# Patient Record
Sex: Female | Born: 1935 | Race: White | Hispanic: No | Smoking: Former smoker
Health system: Southern US, Community
[De-identification: ages and names within clinical notes are randomized; demographics above are authoritative.]

## PROBLEM LIST (undated history)

## (undated) DIAGNOSIS — I10 Essential (primary) hypertension: Secondary | ICD-10-CM

## (undated) DIAGNOSIS — I2510 Atherosclerotic heart disease of native coronary artery without angina pectoris: Secondary | ICD-10-CM

## (undated) DIAGNOSIS — I350 Nonrheumatic aortic (valve) stenosis: Secondary | ICD-10-CM

## (undated) DIAGNOSIS — I4891 Unspecified atrial fibrillation: Secondary | ICD-10-CM

## (undated) HISTORY — PX: TRANSCATHETER AORTIC VALVE REPLACEMENT, TRANSFEMORAL: SHX6400

---

## 2019-06-25 DIAGNOSIS — I350 Nonrheumatic aortic (valve) stenosis: Secondary | ICD-10-CM

## 2019-06-25 DIAGNOSIS — I480 Paroxysmal atrial fibrillation: Secondary | ICD-10-CM | POA: Diagnosis present

## 2019-06-25 DIAGNOSIS — I2510 Atherosclerotic heart disease of native coronary artery without angina pectoris: Secondary | ICD-10-CM | POA: Diagnosis present

## 2019-06-25 DIAGNOSIS — E782 Mixed hyperlipidemia: Secondary | ICD-10-CM | POA: Diagnosis present

## 2019-06-25 DIAGNOSIS — I10 Essential (primary) hypertension: Secondary | ICD-10-CM | POA: Diagnosis present

## 2019-08-23 DIAGNOSIS — Z952 Presence of prosthetic heart valve: Secondary | ICD-10-CM

## 2019-10-30 ENCOUNTER — Encounter (HOSPITAL_COMMUNITY): Payer: Self-pay | Admitting: Family Medicine

## 2019-10-30 ENCOUNTER — Emergency Department (HOSPITAL_COMMUNITY): Payer: Medicare Other

## 2019-10-30 ENCOUNTER — Other Ambulatory Visit: Payer: Self-pay

## 2019-10-30 ENCOUNTER — Inpatient Hospital Stay (HOSPITAL_COMMUNITY)
Admission: EM | Admit: 2019-10-30 | Discharge: 2019-11-01 | DRG: 071 | Disposition: A | Discharge status: Home Health | Payer: Medicare Other | Attending: Internal Medicine | Admitting: Internal Medicine

## 2019-10-30 ENCOUNTER — Inpatient Hospital Stay (HOSPITAL_COMMUNITY): Payer: Medicare Other

## 2019-10-30 DIAGNOSIS — E041 Nontoxic single thyroid nodule: Secondary | ICD-10-CM | POA: Diagnosis present

## 2019-10-30 DIAGNOSIS — Z91040 Latex allergy status: Secondary | ICD-10-CM

## 2019-10-30 DIAGNOSIS — I480 Paroxysmal atrial fibrillation: Secondary | ICD-10-CM | POA: Diagnosis present

## 2019-10-30 DIAGNOSIS — Z952 Presence of prosthetic heart valve: Secondary | ICD-10-CM

## 2019-10-30 DIAGNOSIS — Z7901 Long term (current) use of anticoagulants: Secondary | ICD-10-CM | POA: Diagnosis not present

## 2019-10-30 DIAGNOSIS — E782 Mixed hyperlipidemia: Secondary | ICD-10-CM | POA: Diagnosis present

## 2019-10-30 DIAGNOSIS — F329 Major depressive disorder, single episode, unspecified: Secondary | ICD-10-CM | POA: Diagnosis present

## 2019-10-30 DIAGNOSIS — Z880 Allergy status to penicillin: Secondary | ICD-10-CM

## 2019-10-30 DIAGNOSIS — Z882 Allergy status to sulfonamides status: Secondary | ICD-10-CM

## 2019-10-30 DIAGNOSIS — I2510 Atherosclerotic heart disease of native coronary artery without angina pectoris: Secondary | ICD-10-CM | POA: Diagnosis not present

## 2019-10-30 DIAGNOSIS — I350 Nonrheumatic aortic (valve) stenosis: Secondary | ICD-10-CM

## 2019-10-30 DIAGNOSIS — K219 Gastro-esophageal reflux disease without esophagitis: Secondary | ICD-10-CM | POA: Diagnosis present

## 2019-10-30 DIAGNOSIS — I10 Essential (primary) hypertension: Secondary | ICD-10-CM | POA: Diagnosis present

## 2019-10-30 DIAGNOSIS — G9341 Metabolic encephalopathy: Principal | ICD-10-CM | POA: Diagnosis present

## 2019-10-30 DIAGNOSIS — R4701 Aphasia: Secondary | ICD-10-CM | POA: Diagnosis present

## 2019-10-30 DIAGNOSIS — R479 Unspecified speech disturbances: Secondary | ICD-10-CM

## 2019-10-30 DIAGNOSIS — Z888 Allergy status to other drugs, medicaments and biological substances status: Secondary | ICD-10-CM

## 2019-10-30 DIAGNOSIS — Z20822 Contact with and (suspected) exposure to covid-19: Secondary | ICD-10-CM | POA: Diagnosis present

## 2019-10-30 DIAGNOSIS — Z79899 Other long term (current) drug therapy: Secondary | ICD-10-CM

## 2019-10-30 DIAGNOSIS — E876 Hypokalemia: Secondary | ICD-10-CM | POA: Diagnosis present

## 2019-10-30 HISTORY — DX: Unspecified atrial fibrillation: I48.91

## 2019-10-30 HISTORY — DX: Atherosclerotic heart disease of native coronary artery without angina pectoris: I25.10

## 2019-10-30 HISTORY — DX: Essential (primary) hypertension: I10

## 2019-10-30 HISTORY — DX: Nonrheumatic aortic (valve) stenosis: I35.0

## 2019-10-30 LAB — CBC
HCT: 34.4 % — ABNORMAL LOW (ref 36.0–46.0)
Hemoglobin: 10.2 g/dL — ABNORMAL LOW (ref 12.0–15.0)
MCH: 21.9 pg — ABNORMAL LOW (ref 26.0–34.0)
MCHC: 29.7 g/dL — ABNORMAL LOW (ref 30.0–36.0)
MCV: 74 fL — ABNORMAL LOW (ref 80.0–100.0)
Platelets: 203 10*3/uL (ref 150–400)
RBC: 4.65 MIL/uL (ref 3.87–5.11)
RDW: 18.3 % — ABNORMAL HIGH (ref 11.5–15.5)
WBC: 8.5 10*3/uL (ref 4.0–10.5)
nRBC: 0 % (ref 0.0–0.2)

## 2019-10-30 LAB — DIFFERENTIAL
Abs Immature Granulocytes: 0.02 10*3/uL (ref 0.00–0.07)
Basophils Absolute: 0 10*3/uL (ref 0.0–0.1)
Basophils Relative: 1 %
Eosinophils Absolute: 0.1 10*3/uL (ref 0.0–0.5)
Eosinophils Relative: 1 %
Immature Granulocytes: 0 %
Lymphocytes Relative: 17 %
Lymphs Abs: 1.4 10*3/uL (ref 0.7–4.0)
Monocytes Absolute: 0.5 10*3/uL (ref 0.1–1.0)
Monocytes Relative: 6 %
Neutro Abs: 6.4 10*3/uL (ref 1.7–7.7)
Neutrophils Relative %: 75 %

## 2019-10-30 LAB — COMPREHENSIVE METABOLIC PANEL
ALT: 15 U/L (ref 0–44)
AST: 19 U/L (ref 15–41)
Albumin: 3.6 g/dL (ref 3.5–5.0)
Alkaline Phosphatase: 100 U/L (ref 38–126)
Anion gap: 13 (ref 5–15)
BUN: 11 mg/dL (ref 8–23)
CO2: 28 mmol/L (ref 22–32)
Calcium: 10.4 mg/dL — ABNORMAL HIGH (ref 8.9–10.3)
Chloride: 99 mmol/L (ref 98–111)
Creatinine, Ser: 0.65 mg/dL (ref 0.44–1.00)
GFR calc Af Amer: >60 mL/min (ref >60)
GFR calc non Af Amer: >60 mL/min (ref >60)
Glucose, Bld: 97 mg/dL (ref 70–99)
Potassium: 2.9 mmol/L — ABNORMAL LOW (ref 3.5–5.1)
Sodium: 140 mmol/L (ref 135–145)
Total Bilirubin: 1.2 mg/dL (ref 0.3–1.2)
Total Protein: 7.1 g/dL (ref 6.5–8.1)

## 2019-10-30 LAB — RAPID URINE DRUG SCREEN, HOSP PERFORMED
Amphetamines: NOT DETECTED
Barbiturates: NOT DETECTED
Benzodiazepines: NOT DETECTED
Cocaine: NOT DETECTED
Opiates: NOT DETECTED
Tetrahydrocannabinol: NOT DETECTED

## 2019-10-30 LAB — URINALYSIS, ROUTINE W REFLEX MICROSCOPIC
Bacteria, UA: NONE SEEN
Bilirubin Urine: NEGATIVE
Glucose, UA: NEGATIVE mg/dL
Hgb urine dipstick: NEGATIVE
Ketones, ur: NEGATIVE mg/dL
Nitrite: NEGATIVE
Protein, ur: NEGATIVE mg/dL
Specific Gravity, Urine: 1.004 — ABNORMAL LOW (ref 1.005–1.030)
pH: 6 (ref 5.0–8.0)

## 2019-10-30 LAB — MAGNESIUM: Magnesium: 1.5 mg/dL — ABNORMAL LOW (ref 1.7–2.4)

## 2019-10-30 LAB — PROTIME-INR
INR: 1.7 — ABNORMAL HIGH (ref 0.8–1.2)
Prothrombin Time: 19.2 seconds — ABNORMAL HIGH (ref 11.4–15.2)

## 2019-10-30 LAB — SARS CORONAVIRUS 2 BY RT PCR (HOSPITAL ORDER, PERFORMED IN ~~LOC~~ HOSPITAL LAB): SARS Coronavirus 2: NEGATIVE

## 2019-10-30 LAB — ETHANOL: Alcohol, Ethyl (B): <10 mg/dL (ref <10)

## 2019-10-30 LAB — APTT: aPTT: 42 seconds — ABNORMAL HIGH (ref 24–36)

## 2019-10-30 MED ORDER — POTASSIUM CHLORIDE CRYS ER 20 MEQ PO TBCR
40.0000 meq | EXTENDED_RELEASE_TABLET | Freq: Two times a day (BID) | ORAL | Status: DC
  Administered 2019-10-30 – 2019-11-01 (×4): 40 meq via ORAL
  Filled 2019-10-30: qty 4
  Filled 2019-10-30 (×3): qty 2

## 2019-10-30 MED ORDER — ESCITALOPRAM OXALATE 10 MG PO TABS
10.0000 mg | ORAL_TABLET | Freq: Every day | ORAL | Status: DC
  Administered 2019-10-30 – 2019-10-31 (×2): 10 mg via ORAL
  Filled 2019-10-30 (×2): qty 1

## 2019-10-30 MED ORDER — ASPIRIN 300 MG RE SUPP: 300.0000 mg | Freq: Every day | RECTAL | Status: DC

## 2019-10-30 MED ORDER — STROKE: EARLY STAGES OF RECOVERY BOOK
Freq: Once | Status: DC
  Filled 2019-10-30: qty 1

## 2019-10-30 MED ORDER — ACETAMINOPHEN 160 MG/5ML PO SOLN: 650.0000 mg | ORAL | Status: DC | PRN

## 2019-10-30 MED ORDER — IOHEXOL 350 MG/ML SOLN
75.0000 mL | Freq: Once | INTRAVENOUS | Status: AC | PRN
  Administered 2019-10-30: 75 mL via INTRAVENOUS

## 2019-10-30 MED ORDER — SENNOSIDES-DOCUSATE SODIUM 8.6-50 MG PO TABS
1.0000 | ORAL_TABLET | Freq: Every evening | ORAL | Status: DC | PRN
  Filled 2019-10-30: qty 1

## 2019-10-30 MED ORDER — ACETAMINOPHEN 325 MG PO TABS: 650.0000 mg | ORAL_TABLET | ORAL | Status: DC | PRN

## 2019-10-30 MED ORDER — ACETAMINOPHEN 650 MG RE SUPP: 650.0000 mg | RECTAL | Status: DC | PRN

## 2019-10-30 MED ORDER — APIXABAN 5 MG PO TABS
5.0000 mg | ORAL_TABLET | Freq: Two times a day (BID) | ORAL | Status: DC
  Administered 2019-10-30 – 2019-11-01 (×4): 5 mg via ORAL
  Filled 2019-10-30 (×4): qty 1

## 2019-10-30 MED ORDER — MAGNESIUM SULFATE 50 % IJ SOLN: 2.0000 g | Freq: Once | INTRAMUSCULAR | Status: DC

## 2019-10-30 MED ORDER — POTASSIUM CHLORIDE CRYS ER 20 MEQ PO TBCR
40.0000 meq | EXTENDED_RELEASE_TABLET | Freq: Once | ORAL | Status: AC
  Administered 2019-10-30: 40 meq via ORAL
  Filled 2019-10-30: qty 2

## 2019-10-30 MED ORDER — ASPIRIN 325 MG PO TABS
325.0000 mg | ORAL_TABLET | Freq: Every day | ORAL | Status: DC
  Administered 2019-10-31 – 2019-11-01 (×2): 325 mg via ORAL
  Filled 2019-10-30 (×2): qty 1

## 2019-10-30 MED ORDER — PANTOPRAZOLE SODIUM 40 MG PO TBEC
40.0000 mg | DELAYED_RELEASE_TABLET | Freq: Every day | ORAL | Status: DC
  Administered 2019-10-31 – 2019-11-01 (×2): 40 mg via ORAL
  Filled 2019-10-30 (×2): qty 1

## 2019-10-30 MED ORDER — FUROSEMIDE 20 MG PO TABS
20.0000 mg | ORAL_TABLET | Freq: Every morning | ORAL | Status: DC
  Administered 2019-10-31: 20 mg via ORAL
  Filled 2019-10-30: qty 1

## 2019-10-30 MED ORDER — MAGNESIUM SULFATE 2 GM/50ML IV SOLN
2.0000 g | Freq: Once | INTRAVENOUS | Status: AC
  Administered 2019-10-30: 2 g via INTRAVENOUS
  Filled 2019-10-30: qty 50

## 2019-10-30 NOTE — ED Triage Notes (Signed)
Per EMS, pt family c/o slurred speech, dizziness, and blurry vision first presenting 1-2 weeks ago with progressive worsening. Family also reports choking on food last night and weight loss since January. Family concerned about loose fitting dentures and oral intake from weight loss.

## 2019-10-30 NOTE — H&P (Signed)
History and Physical  [facility] DDU:[id number] DOB: [birth year] DOA: 10/30/2019  Referring physician: Dr Particia Nearing, ED physician PCP: System, Pcp Not In  Outpatient Specialists:   Dr Neysa Bonito [facility])  Patient Coming From: home  Chief Complaint: difficulty speaking.  HPI: Lindsay Scott is a 85 y.o. female with a history of nonrheumatic aortic stenosis with history of transcatheter valve replacement, paroxysmal atrial fibrillation on chronic anticoagulation with Eliquis.  Patient seen for 10 days of slurred speech and dizziness.  Initially, her symptoms were mild, but they became gradually worse.  Initially, her family members thought it was due to her weight loss and her dentures not fitting well.  However, her symptoms became worse yesterday and even worse today where her family was having a difficult time understanding her.  Although they can make out some words, there are many other words that appear to be nonsensical in nature.  In talking with the patient, the patient indicates that she understands what is being said, and knows what she wants to say, however the words come out garbled.  Additionally, the patient has been having vertigo and had a headache yesterday.  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  Potassium low at 2.9.  Magnesium at 1.5.  Calcium slightly elevated at 10.4.  Review of Systems:   Pt denies any fevers, chills, nausea, vomiting, diarrhea, constipation, abdominal pain, shortness of breath, dyspnea on exertion, orthopnea, cough, wheezing, palpitations, headache, vision changes, lightheadedness, melena, rectal bleeding.  Review of systems are otherwise negative  Past Medical History:  Diagnosis Date  . Atrial fibrillation (HCC)   . Coronary artery disease   . Hypertension   . Nonrheumatic aortic (valve) stenosis    Past Surgical History:  Procedure Laterality Date  . TRANSCATHETER AORTIC  VALVE REPLACEMENT, TRANSFEMORAL     Social History:  has no history on file for tobacco use, alcohol use, and drug use. Patient lives at home  Allergies  Allergen Reactions  . Betadine [Povidone Iodine] Other (See Comments)  . Latex Other (See Comments)    unkn  . Penicillins Swelling  . Sulfa Antibiotics Other (See Comments)    unkn    No family history on file.  Family history reviewed and noncontributory at this point  Prior to Admission medications   Medication Sig Start Date End Date Taking? Authorizing Provider  apixaban (ELIQUIS) 5 MG TABS tablet Take 5 mg by mouth 2 (two) times daily.   Yes [provider]  escitalopram (LEXAPRO) 10 MG tablet Take 10 mg by mouth at bedtime.   Yes [provider]  ferrous sulfate 325 (65 FE) MG EC tablet Take 325 mg by mouth daily.   Yes [provider]  folic acid (FOLVITE) 1 MG tablet Take 1 mg by mouth daily.   Yes [provider]  furosemide (LASIX) 20 MG tablet Take 20 mg by mouth in the morning.   Yes [provider]  meloxicam (MOBIC) 7.5 MG tablet Take 7.5 mg by mouth in the morning.   Yes [provider]  metoprolol tartrate (LOPRESSOR) 100 MG tablet Take 100 mg by mouth 2 (two) times daily.   Yes [provider]  omeprazole (PRILOSEC) 20 MG capsule Take 20 mg by mouth daily before breakfast.   Yes [provider]  potassium chloride (KLOR-CON) 10 MEQ tablet Take 10 mEq by mouth daily.   Yes [provider]  Vitamin D, Ergocalciferol, (DRISDOL)  1.25 MG (50000 UNIT) CAPS capsule Take 50,000 Units by mouth every Sunday.   Yes [provider]    Physical Exam: BP (!) 152/82   Pulse (!) 102   Resp 19   SpO2 99%   . General: Elderly female. Awake and alert. No acute cardiopulmonary distress.  Marland Kitchen HEENT: Normocephalic atraumatic.  Right and left ears normal in appearance.  Pupils equal, round, reactive to light. Extraocular muscles are intact.  Sclerae anicteric and noninjected.  Moist mucosal membranes. No mucosal lesions.  . Neck: Neck supple without lymphadenopathy. No carotid bruits. No masses palpated.  . Cardiovascular: Regular rate with normal S1-S2 sounds. No murmurs, rubs, gallops auscultated. No JVD.  Marland Kitchen Respiratory: Good respiratory effort with no wheezes, rales, rhonchi. Lungs clear to auscultation bilaterally.  No accessory muscle use. . Abdomen: Soft, nontender, nondistended. Active bowel sounds. No masses or hepatosplenomegaly  . Skin: No rashes, lesions, or ulcerations.  Dry, warm to touch. 2+ dorsalis pedis and radial pulses. . Musculoskeletal: No calf or leg pain. All major joints not erythematous nontender.  No upper or lower joint deformation.  Good ROM.  No contractures  . Psychiatric: Difficult to assess secondary to patient's speech deficits. . Neurologic: No focal neurological deficits. Strength is 5/5 and symmetric in upper and lower extremities.  Cranial nerves II through XII are grossly intact.  Coordination intact           Labs on Admission: I have personally reviewed following labs and imaging studies  CBC: Recent Labs  Lab 10/30/19 1915  WBC 8.5  NEUTROABS 6.4  HGB 10.2*  HCT 34.4*  MCV 74.0*  PLT 203   Basic Metabolic Panel: Recent Labs  Lab 10/30/19 1915  NA 140  K 2.9*  CL 99  CO2 28  GLUCOSE 97  BUN 11  CREATININE 0.65  CALCIUM 10.4*  MG 1.5*   GFR: CrCl cannot be calculated (Unknown ideal weight.). Liver Function Tests: Recent Labs  Lab 10/30/19 1915  AST 19  ALT 15  ALKPHOS 100  BILITOT 1.2  PROT 7.1  ALBUMIN 3.6   No results for input(s): LIPASE, AMYLASE in the last 168 hours. No results for input(s): AMMONIA in the last 168 hours. Coagulation Profile: Recent Labs  Lab 10/30/19 1915  INR 1.7*   Cardiac Enzymes: No results for input(s): CKTOTAL, CKMB, CKMBINDEX, TROPONINI in the last 168 hours. BNP (last 3 results) No results for input(s): PROBNP in the last  8760 hours. HbA1C: No results for input(s): HGBA1C in the last 72 hours. CBG: No results for input(s): GLUCAP in the last 168 hours. Lipid Profile: No results for input(s): CHOL, HDL, LDLCALC, TRIG, CHOLHDL, LDLDIRECT in the last 72 hours. Thyroid Function Tests: No results for input(s): TSH, T4TOTAL, FREET4, T3FREE, THYROIDAB in the last 72 hours. Anemia Panel: No results for input(s): VITAMINB12, FOLATE, FERRITIN, TIBC, IRON, RETICCTPCT in the last 72 hours. Urine analysis: No results found for: COLORURINE, APPEARANCEUR, LABSPEC, PHURINE, GLUCOSEU, HGBUR, BILIRUBINUR, KETONESUR, PROTEINUR, UROBILINOGEN, NITRITE, LEUKOCYTESUR Sepsis Labs: @LABRCNTIP (procalcitonin:4,lacticidven:4) )No results found for this or any previous visit (from the past 240 hour(s)).   Radiological Exams on Admission: CT HEAD WO CONTRAST  Result Date: 10/30/2019 CLINICAL DATA:  85 year old with slurred speech, dizziness, and blurred vision which began approximately 1-2 weeks ago with progressive worsening. Episode of choking on food last night. Weight loss since January, 2021. EXAM: CT HEAD WITHOUT CONTRAST TECHNIQUE: Contiguous axial images were obtained from the base of the skull through the vertex without intravenous  contrast. COMPARISON:  None. FINDINGS: Brain: Ventricular system normal in size and appearance for age. Moderate to marked bifrontal cortical atrophy and/or subdural hygromas with mild age related atrophy elsewhere. Mild changes of small vessel disease of the white matter diffusely. No mass lesion. No midline shift. No acute hemorrhage or hematoma. No extra-axial fluid collections. No evidence of acute infarction. Vascular: Moderate to severe BILATERAL carotid siphon and moderate BILATERAL vertebral artery atherosclerosis. No hyperdense vessel. Skull: No skull fracture or other focal osseous abnormality involving the skull. Sinuses/Orbits: Visualized paranasal sinuses, bilateral mastoid air cells and  bilateral middle ear cavities well-aerated. Visualized orbits and globes normal in appearance. Other: None. IMPRESSION: 1. No acute intracranial abnormality. 2. Moderate to marked bifrontal cortical atrophy and/or subdural hygromas with mild age related atrophy elsewhere. 3. Mild chronic microvascular ischemic changes of the white matter diffusely. Electronically Signed   By: Hulan Saas M.D.   On: 10/30/2019 19:51    EKG: Independently reviewed.  Atrial fibrillation with right axis deviation.  No acute ST changes.  Assessment/Plan: Principal Problem:   Aphasia Active Problems:   Essential hypertension   Hyperlipidemia, mixed   Nonrheumatic aortic valve stenosis   PAF (paroxysmal atrial fibrillation) (HCC)   S/P TAVR (transcatheter aortic valve replacement)   Coronary artery disease involving native coronary artery of native heart without angina pectoris   Hypokalemia   Hypomagnesemia    This patient was discussed with the ED physician, including pertinent vitals, physical exam findings, labs, and imaging.  We also discussed care given by the ED provider.  1. Aphasia a. There is a question of whether this is secondary to a stroke or another process.   b. Check TSH, ammonia level, B12 c. Check UA, UDS d. Additionally, the patient's calcium level is barely elevated.  While hypercalcemia can cause altered encephalopathy, it is usually found at levels much higher.  We will recheck calcium in the morning e. Admit f. Telemetry g. MRI/MRA head h. CT angio neck  i. echocardiogram tomorrow j. Hemoglobin A1c, lipid panel in the morning k. PT/OT/speech therapy consult l. Full aspirin m. Permissive hypertension for now 2. Hypokalemia a. Replace b. Check potassium in the morning 3. Hypomagnesemia a. Replace 4. Hypertension a. Permissive hypertension 5. PAF a. On Eliquis 6. Status post aortic valve replacement a.   DVT prophylaxis: Eliquis Consultants: None Code Status: Full  code Family Communication: Daughter present during interview and exam Disposition Plan: Pending   Levie Heritage, DO

## 2019-10-30 NOTE — ED Provider Notes (Signed)
[facility] EMERGENCY DEPARTMENT Provider Note   CSN: [id number] Arrival date & time: 10/30/19  1851     History Chief Complaint  Patient presents with  . Aphasia    Lindsay Scott is a 85 y.o. female.  Pt presents to the ED today with slurred speech.  Pt is from home.  EMS gives the hx.  Per EMS, pt has had slurred speech for the past 2.5 weeks.  She lives with family who thought she was having difficulty speaking due to her not having her dentures in.  Pt is very difficult to understand, so history is difficult.  Pt's daughter called.  She said that her speech has worsened in the last 24 hrs.  She had difficulty swallowing today and looked off balance when walking today.       Pmhx: afib gerd Arthritis chf Depression htn Aortic stenosis HOH   Surg Hx Aortic Valve replacement June 29th  OB History   No obstetric history on file.     No family history on file.  Social History   Tobacco Use  . Smoking status: Not on file  Substance Use Topics  . Alcohol use: Not on file  . Drug use: Not on file  no tob/etoh  Home Medications Prior to Admission medications   Medication Sig Start Date End Date Taking? Authorizing Provider  apixaban (ELIQUIS) 5 MG TABS tablet Take 5 mg by mouth 2 (two) times daily.   Yes [provider]  escitalopram (LEXAPRO) 10 MG tablet Take 10 mg by mouth at bedtime.   Yes [provider]  ferrous sulfate 325 (65 FE) MG EC tablet Take 325 mg by mouth daily.   Yes [provider]  folic acid (FOLVITE) 1 MG tablet Take 1 mg by mouth daily.   Yes [provider]  furosemide (LASIX) 20 MG tablet Take 20 mg by mouth in the morning.   Yes [provider]  meloxicam (MOBIC) 7.5 MG tablet Take 7.5 mg by mouth in the morning.   Yes [provider]  metoprolol tartrate (LOPRESSOR) 100 MG tablet Take 100 mg by mouth 2 (two) times daily.   Yes [provider]  omeprazole (PRILOSEC) 20 MG  capsule Take 20 mg by mouth daily before breakfast.   Yes [provider]  potassium chloride (KLOR-CON) 10 MEQ tablet Take 10 mEq by mouth daily.   Yes [provider]  Vitamin D, Ergocalciferol, (DRISDOL) 1.25 MG (50000 UNIT) CAPS capsule Take 50,000 Units by mouth every Sunday.   Yes [provider]   Omeprazole meloxicam eliquis Iron Folic acid kcl Furosemide Vitamin D Metoprolol escitalopram  Allergies    Betadine [povidone iodine], Latex, Penicillins, and Sulfa antibiotics  Review of Systems   Review of Systems  Neurological: Positive for speech difficulty.  All other systems reviewed and are negative.   Physical Exam Updated Vital Signs BP (!) 152/82   Pulse (!) 105   Resp (!) 21   SpO2 100%   Physical Exam Vitals and nursing note reviewed.  Constitutional:      Appearance: Normal appearance.  HENT:     Head: Normocephalic and atraumatic.     Right Ear: External ear normal.     Left Ear: External ear normal.     Nose: Nose normal.     Mouth/Throat:     Mouth: Mucous membranes are moist.     Pharynx: Oropharynx is clear.  Eyes:     Extraocular Movements: Extraocular movements  intact.     Conjunctiva/sclera: Conjunctivae normal.     Pupils: Pupils are equal, round, and reactive to light.  Cardiovascular:     Rate and Rhythm: Tachycardia present. Rhythm irregular.     Pulses: Normal pulses.     Heart sounds: Normal heart sounds.  Pulmonary:     Effort: Pulmonary effort is normal.     Breath sounds: Normal breath sounds.  Abdominal:     General: Abdomen is flat. Bowel sounds are normal.     Palpations: Abdomen is soft.  Musculoskeletal:        General: Normal range of motion.     Cervical back: Normal range of motion and neck supple.  Skin:    General: Skin is warm.     Capillary Refill: Capillary refill takes less than 2 seconds.  Neurological:     Mental Status: She is alert.     Comments: Pt is awake and alert.  She is  moving all 4 extremities.  Speech is garbled and very difficult to understand.     ED Results / Procedures / Treatments   Labs (all labs ordered are listed, but only abnormal results are displayed) Labs Reviewed  PROTIME-INR - Abnormal; Notable for the following components:      Result Value   Prothrombin Time 19.2 (*)    INR 1.7 (*)    All other components within normal limits  APTT - Abnormal; Notable for the following components:   aPTT 42 (*)    All other components within normal limits  CBC - Abnormal; Notable for the following components:   Hemoglobin 10.2 (*)    HCT 34.4 (*)    MCV 74.0 (*)    MCH 21.9 (*)    MCHC 29.7 (*)    RDW 18.3 (*)    All other components within normal limits  COMPREHENSIVE METABOLIC PANEL - Abnormal; Notable for the following components:   Potassium 2.9 (*)    Calcium 10.4 (*)    All other components within normal limits  SARS CORONAVIRUS 2 BY RT PCR (HOSPITAL ORDER, PERFORMED IN [facility])  ETHANOL  DIFFERENTIAL  RAPID URINE DRUG SCREEN, HOSP PERFORMED  URINALYSIS, ROUTINE W REFLEX MICROSCOPIC  MAGNESIUM    EKG EKG Interpretation  Date/Time:  Saturday October 30 2019 19:17:40 EDT Ventricular Rate:  103 PR Interval:    QRS Duration: 91 QT Interval:  335 QTC Calculation: 439 R Axis:   91 Text Interpretation: Atrial fibrillation Right axis deviation Low voltage, precordial leads No old tracing to compare Confirmed by Jacalyn Lefevre [phone]) on 10/30/2019 7:38:16 PM   Radiology CT HEAD WO CONTRAST  Result Date: 10/30/2019 CLINICAL DATA:  85 year old with slurred speech, dizziness, and blurred vision which began approximately 1-2 weeks ago with progressive worsening. Episode of choking on food last night. Weight loss since January, 2021. EXAM: CT HEAD WITHOUT CONTRAST TECHNIQUE: Contiguous axial images were obtained from the base of the skull through the vertex without intravenous contrast. COMPARISON:  None. FINDINGS:  Brain: Ventricular system normal in size and appearance for age. Moderate to marked bifrontal cortical atrophy and/or subdural hygromas with mild age related atrophy elsewhere. Mild changes of small vessel disease of the white matter diffusely. No mass lesion. No midline shift. No acute hemorrhage or hematoma. No extra-axial fluid collections. No evidence of acute infarction. Vascular: Moderate to severe BILATERAL carotid siphon and moderate BILATERAL vertebral artery atherosclerosis. No hyperdense vessel. Skull: No skull fracture or other focal osseous abnormality involving  the skull. Sinuses/Orbits: Visualized paranasal sinuses, bilateral mastoid air cells and bilateral middle ear cavities well-aerated. Visualized orbits and globes normal in appearance. Other: None. IMPRESSION: 1. No acute intracranial abnormality. 2. Moderate to marked bifrontal cortical atrophy and/or subdural hygromas with mild age related atrophy elsewhere. 3. Mild chronic microvascular ischemic changes of the white matter diffusely. Electronically Signed   By: Hulan Saas M.D.   On: 10/30/2019 19:51    Procedures Procedures (including critical care time)  Medications Ordered in ED Medications  potassium chloride SA (KLOR-CON) CR tablet 40 mEq (40 mEq Oral Given 10/30/19 2037)    ED Course  I have reviewed the triage vital signs and the nursing notes.  Pertinent labs & imaging results that were available during my care of the patient were reviewed by me and considered in my medical decision making (see chart for details).    MDM Rules/Calculators/A&P                         CT shows nothing acute.  MRI unavailable now.  Pt is on Eliquis for her afib which is chronic.   CHA2DS2/VAS Stroke Risk Points  2 (age, htn)     Pt d/w Dr. Adrian Blackwater (triad) for admission.       Final Clinical Impression(s) / ED Diagnoses Final diagnoses:  Speech disturbance, unspecified type  Hypokalemia    Rx / DC Orders ED Discharge  Orders    None       Jacalyn Lefevre, MD 10/30/19 2042

## 2019-10-31 ENCOUNTER — Encounter (HOSPITAL_COMMUNITY): Payer: Self-pay | Admitting: Family Medicine

## 2019-10-31 ENCOUNTER — Inpatient Hospital Stay (HOSPITAL_COMMUNITY): Payer: Medicare Other

## 2019-10-31 DIAGNOSIS — R4701 Aphasia: Secondary | ICD-10-CM

## 2019-10-31 LAB — COMPREHENSIVE METABOLIC PANEL
ALT: 13 U/L (ref 0–44)
AST: 18 U/L (ref 15–41)
Albumin: 3.2 g/dL — ABNORMAL LOW (ref 3.5–5.0)
Alkaline Phosphatase: 87 U/L (ref 38–126)
Anion gap: 9 (ref 5–15)
BUN: 10 mg/dL (ref 8–23)
CO2: 26 mmol/L (ref 22–32)
Calcium: 9.7 mg/dL (ref 8.9–10.3)
Chloride: 105 mmol/L (ref 98–111)
Creatinine, Ser: 0.68 mg/dL (ref 0.44–1.00)
GFR calc Af Amer: >60 mL/min (ref >60)
GFR calc non Af Amer: >60 mL/min (ref >60)
Glucose, Bld: 93 mg/dL (ref 70–99)
Potassium: 3.7 mmol/L (ref 3.5–5.1)
Sodium: 140 mmol/L (ref 135–145)
Total Bilirubin: 1.4 mg/dL — ABNORMAL HIGH (ref 0.3–1.2)
Total Protein: 6.4 g/dL — ABNORMAL LOW (ref 6.5–8.1)

## 2019-10-31 LAB — HEMOGLOBIN A1C
Hgb A1c MFr Bld: 5.5 % (ref 4.8–5.6)
Mean Plasma Glucose: 111.15 mg/dL

## 2019-10-31 LAB — LIPID PANEL
Cholesterol: 142 mg/dL (ref 0–200)
HDL: 29 mg/dL — ABNORMAL LOW (ref >40)
LDL Cholesterol: 94 mg/dL (ref 0–99)
Total CHOL/HDL Ratio: 4.9 RATIO
Triglycerides: 97 mg/dL (ref <150)
VLDL: 19 mg/dL (ref 0–40)

## 2019-10-31 LAB — TSH: TSH: 0.73 u[IU]/mL (ref 0.350–4.500)

## 2019-10-31 LAB — AMMONIA: Ammonia: 23 umol/L (ref 9–35)

## 2019-10-31 LAB — VITAMIN B12: Vitamin B-12: 465 pg/mL (ref 180–914)

## 2019-10-31 MED ORDER — METOPROLOL TARTRATE 50 MG PO TABS
50.0000 mg | ORAL_TABLET | Freq: Two times a day (BID) | ORAL | Status: DC
  Administered 2019-10-31 (×2): 50 mg via ORAL
  Filled 2019-10-31 (×3): qty 1

## 2019-10-31 MED ORDER — METOPROLOL TARTRATE 25 MG PO TABS: 12.5000 mg | ORAL_TABLET | Freq: Two times a day (BID) | ORAL | Status: DC

## 2019-10-31 NOTE — Progress Notes (Signed)
Patient arrived to floor.  Patient has a h/o a. Fib.  Patient is a. Fib on the monitor with pulse going from 110s to 130s at times non-sustained. Patient has metoprolol her PTA meds.  Patient has not received metoprolol on floor, nor is med ordered at this time.  MD advised to monitor patient at this time. If heart rate became to sustained then to notify MD, but at this time no new interventions ordered.

## 2019-10-31 NOTE — Evaluation (Signed)
Physical Therapy Evaluation Patient Details Name: Lindsay Scott MRN: [id number] DOB: 05/09/1934 Today's Date: 10/31/2019   History of Present Illness  Lindsay Scott is a 85 y.o. female with a history of nonrheumatic aortic stenosis with history of transcatheter valve replacement, paroxysmal atrial fibrillation on chronic anticoagulation with Eliquis.  Patient seen for 10 days of slurred speech and dizziness.  Initially, her symptoms were mild, but they became gradually worse.  Initially, her family members thought it was due to her weight loss and her dentures not fitting well.  However, her symptoms became worse yesterday and even worse today where her family was having a difficult time understanding her.  Although they can make out some words, there are many other words that appear to be nonsensical in nature.  In talking with the patient, the patient indicates that she understands what is being said, and knows what she wants to say, however the words come out garbled.    Clinical Impression  Patient functioning near baseline for functional mobility and gait other than occasional drifting right/left without loss of balance and able to self correct, demonstrates good return for ambulation on level, inclined, declined surfaces and going up/down stairs.  Plan:  Patient discharged from physical therapy to care of nursing for ambulation daily as tolerated for length of stay.     Follow Up Recommendations No PT follow up;Supervision - Intermittent    Equipment Recommendations  None recommended by PT    Recommendations for Other Services       Precautions / Restrictions Precautions Precautions: None Restrictions Weight Bearing Restrictions: No      Mobility  Bed Mobility Overal bed mobility: Modified Independent                Transfers Overall transfer level: Modified independent                  Ambulation/Gait Ambulation/Gait assistance: Modified independent  (Device/Increase time) Gait Distance (Feet): 200 Feet Assistive device: None Gait Pattern/deviations: Drifts right/left Gait velocity: slightly decreased   General Gait Details: demonstrates good return for ambulation on level, inclined and declined surfaces without loss of balance, occasional drifting right/left and able to self correct without assistance  Stairs Stairs: Yes Stairs assistance: Supervision Stair Management: Two rails;Alternating pattern;Step to pattern Number of Stairs: 10 General stair comments: demonstrates good return for going up stairs with alternating pattern, required step to pattern when coming down without loss of balance using bilateral siderails  Wheelchair Mobility    Modified Rankin (Stroke Patients Only)       Balance Overall balance assessment: Mild deficits observed, not formally tested                                           Pertinent Vitals/Pain Pain Assessment: No/denies pain    Home Living Family/patient expects to be discharged to:: Private residence Living Arrangements: Children Available Help at Discharge: Family;Available PRN/intermittently Type of Home: House Home Access: Stairs to enter Entrance Stairs-Rails: Right;Left;Can reach both Entrance Stairs-Number of Steps: 6-7 Home Layout: One level Home Equipment: Walker - 2 wheels      Prior Function Level of Independence: Independent with assistive device(s)         Comments: household ambulator using RW PRN     Hand Dominance        Extremity/Trunk Assessment   Upper Extremity Assessment Upper  Extremity Assessment: Defer to OT evaluation    Lower Extremity Assessment Lower Extremity Assessment: Overall WFL for tasks assessed    Cervical / Trunk Assessment Cervical / Trunk Assessment: Normal  Communication   Communication: Expressive difficulties  Cognition Arousal/Alertness: Awake/alert Behavior During Therapy: WFL for tasks  assessed/performed Overall Cognitive Status: Within Functional Limits for tasks assessed                                        General Comments      Exercises     Assessment/Plan    PT Assessment Patent does not need any further PT services  PT Problem List         PT Treatment Interventions      PT Goals (Current goals can be found in the Care Plan section)  Acute Rehab PT Goals Patient Stated Goal: return home with family to assist PT Goal Formulation: With patient Time For Goal Achievement: 10/31/19 Potential to Achieve Goals: Good    Frequency     Barriers to discharge        Co-evaluation               AM-PAC PT "6 Clicks" Mobility  Outcome Measure Help needed turning from your back to your side while in a flat bed without using bedrails?: None Help needed moving from lying on your back to sitting on the side of a flat bed without using bedrails?: None Help needed moving to and from a bed to a chair (including a wheelchair)?: None Help needed standing up from a chair using your arms (e.g., wheelchair or bedside chair)?: None Help needed to walk in hospital room?: None Help needed climbing 3-5 steps with a railing? : A Little 6 Click Score: 23    End of Session   Activity Tolerance: Patient tolerated treatment well Patient left: in chair Nurse Communication: Mobility status PT Visit Diagnosis: Unsteadiness on feet (R26.81);Other abnormalities of gait and mobility (R26.89);Muscle weakness (generalized) (M62.81)    Time: 4665-9935 PT Time Calculation (min) (ACUTE ONLY): 25 min   Charges:   PT Evaluation $PT Eval Moderate Complexity: 1 Mod PT Treatments $Therapeutic Activity: 23-37 mins        12:56 PM, 10/31/19 Ocie Bob, MPT Physical Therapist with [facility] 336 [phone] office [phone] mobile phone

## 2019-10-31 NOTE — Progress Notes (Signed)
Heart rate is not staying below 125 long enough for echo, will attempt in AM.

## 2019-10-31 NOTE — Progress Notes (Signed)
PROGRESS NOTE  Lindsay Scott  DOB: [birth year]  PCP: System, Pcp Not In DJT:[id number]  DOA: 10/30/2019  LOS: 1 day   Chief Complaint  Patient presents with   Aphasia   Brief narrative: Lindsay Scott is a 85 y.o. female with PMH of nonrheumatic aortic stenosis status post TAVR, paroxysmal A. fib on chronic anticoagulation with Eliquis, hypertension, CAD. Patient presented to the ED on 10/30/2019 with 10 days history of gradually worsening dizziness, slurred speech and difficulty articulating words.  In the ED, patient was afebrile, tachycardic mostly between 100 and 110. Blood work showed potassium level low at 2.9, magnesium at 1.5 CT scan of the head showed old microinfarcts. CT angio of head and neck did not show any large vessel occlusion but showed atherosclerotic change throughout the carotid siphons with associated mild to moderate multifocal stenoses.  Patient was admitted under hospitalist service for further evaluation and management  Subjective: Patient was seen and examined this morning. Thin built pleasant elderly Caucasian female, sitting up in chair.  Not in distress.   Slow to respond, has significant word finding difficulty but oriented to oriented to place, person and time. Chart reviewed.  No fever.  Remains tachycardic to less than 120, O2 sat maintained on room air.  Blood pressure stable Labs this morning with improved potassium level 3.7.  Assessment/Plan: Acute neurological symptoms - TIA vs stroke -patient was brought in with 10 days history of gradually worsening dizziness, slurred speech and difficulty articulating words. -Initially suspected to have acute stroke.  However negative CT scan and CT angio of head and neck for acute findings. -Stroke work up ordered including MRI, Echo, A1c, lipid panel. -A1c 5.5 -Although oriented x3, continue to have word finding and articulation difficulty.   Acute metabolic encephalopathy -Normal ammonia level.  Urine  drug screen negative. -High risk factors for vascular dementia. Recent Labs  Lab 10/31/19 0755  AMMONIA 23  VITAMINB12 465  TSH 0.730  Urine drug screen:    Component Value Date/Time   LABOPIA NONE DETECTED 10/30/2019 1916   COCAINSCRNUR NONE DETECTED 10/30/2019 1916   LABBENZ NONE DETECTED 10/30/2019 1916   AMPHETMU NONE DETECTED 10/30/2019 1916   THCU NONE DETECTED 10/30/2019 1916   LABBARB NONE DETECTED 10/30/2019 1916     Hypercalcemia -Calcium level 10.4 on admission.  Improving. Recent Labs  Lab 10/30/19 1915 10/31/19 0755  CALCIUM 10.4* 9.7   Severe hypokalemia/hypomagnesemia -Replaced.  Improved. Recent Labs  Lab 10/30/19 1915 10/31/19 0755  K 2.9* 3.7  MG 1.5*  --    Essential hypertension -Home meds include Lopressor 100 mg twice daily, Lasix 20 mg daily -Currently blood pressure is controlled on Lasix 20 mg daily but patient is getting tachycardic. -I will stop Lasix and resume metoprolol at a lower dose of 50 mg twice daily. -Continue to monitor blood pressure.  Paroxysmal A. fib -Metoprolol resumed at a lower dose.  Continue Eliquis 5 mg twice daily,  Depression -Continue Lexapro 10 mg daily,  Chronic anemia -Continue Prilosec 20 mg daily, iron supplement, folic acid supplement, -Seems to be on Mobic at home.  Keep it on hold. Recent Labs    10/30/19 1915  HGB 10.2*   Aortic stenosis status post aortic valve replacement  Thyroid nodule -CT scan of neck showed 2.2 cm right thyroid nodule.  Recommend thyroid ultrasound as an outpatient.    Mobility: PT eval pending Code Status:   Code Status: Full Code  Nutritional status: There is no height or  weight on file to calculate BMI.     Diet Order            Diet Heart Room service appropriate? Yes; Fluid consistency: Thin  Diet effective now                 DVT prophylaxis:  apixaban (ELIQUIS) tablet 5 mg   Antimicrobials:  None Fluid: None Consultants: None Family Communication:   Not at bedside  Status is: Inpatient  Remains inpatient appropriate because:Ongoing diagnostic testing needed not appropriate for outpatient work up and IV treatments appropriate due to intensity of illness or inability to take PO   Dispo: The patient is from: Home              Anticipated d/c is to: Home versus SNF.  Pending PT eval              Anticipated d/c date is: 2 days              Patient currently is not medically stable to d/c.       Infusions:    Scheduled Meds:   stroke: mapping our early stages of recovery book   Does not apply Once   apixaban  5 mg Oral BID   aspirin  300 mg Rectal Daily   Or   aspirin  325 mg Oral Daily   escitalopram  10 mg Oral QHS   metoprolol tartrate  50 mg Oral BID   pantoprazole  40 mg Oral Daily   potassium chloride  40 mEq Oral BID    Antimicrobials: Anti-infectives (From admission, onward)   None      PRN meds: acetaminophen **OR** acetaminophen (TYLENOL) oral liquid 160 mg/5 mL **OR** acetaminophen, senna-docusate   Objective: Vitals:   10/31/19 0700 10/31/19 0900  BP: 130/84 (!) 142/89  Pulse: (!) 104 (!) 120  Resp: 18 18  Temp: 98.6 F (37 C) 98 F (36.7 C)  SpO2: 98% 97%    Intake/Output Summary (Last 24 hours) at 10/31/2019 1232 Last data filed at 10/31/2019 0600 Gross per 24 hour  Intake --  Output 2 ml  Net -2 ml   There were no vitals filed for this visit. Weight change:  There is no height or weight on file to calculate BMI.   Physical Exam: General exam: Appears calm and comfortable.  Not in physical distress Skin: No rashes, lesions or ulcers. HEENT: Atraumatic, normocephalic, supple neck, no obvious bleeding Lungs: Clear to auscultation bilaterally CVS: Tachycardic, regular rhythm, no murmur GI/Abd soft, nontender, nondistended, bowel sound present CNS: Alert, awake, oriented x3 Psychiatry: Mood appropriate Extremities: No pedal edema, no calf tenderness  Data Review: I have  personally reviewed the laboratory data and studies available.  Recent Labs  Lab 10/30/19 1915  WBC 8.5  NEUTROABS 6.4  HGB 10.2*  HCT 34.4*  MCV 74.0*  PLT 203   Recent Labs  Lab 10/30/19 1915 10/31/19 0755  NA 140 140  K 2.9* 3.7  CL 99 105  CO2 28 26  GLUCOSE 97 93  BUN 11 10  CREATININE 0.65 0.68  CALCIUM 10.4* 9.7  MG 1.5*  --     F/u labs ordered  Signed, Lorin Glass, MD [facility] 10/31/2019

## 2019-10-31 NOTE — Progress Notes (Signed)
Patient's daughter states that patient is due for MRI tomorrow and patient has transaortic catheter valve replacement. (Bovine Transcatheter Heart Valve). Titanium and Bovine on August 17, 2019.

## 2019-11-01 ENCOUNTER — Inpatient Hospital Stay (HOSPITAL_COMMUNITY): Payer: Medicare Other

## 2019-11-01 LAB — CBC WITH DIFFERENTIAL/PLATELET
Abs Immature Granulocytes: 0.02 10*3/uL (ref 0.00–0.07)
Basophils Absolute: 0 10*3/uL (ref 0.0–0.1)
Basophils Relative: 0 %
Eosinophils Absolute: 0.2 10*3/uL (ref 0.0–0.5)
Eosinophils Relative: 3 %
HCT: 31.1 % — ABNORMAL LOW (ref 36.0–46.0)
Hemoglobin: 8.8 g/dL — ABNORMAL LOW (ref 12.0–15.0)
Immature Granulocytes: 0 %
Lymphocytes Relative: 21 %
Lymphs Abs: 1.2 10*3/uL (ref 0.7–4.0)
MCH: 21.5 pg — ABNORMAL LOW (ref 26.0–34.0)
MCHC: 28.3 g/dL — ABNORMAL LOW (ref 30.0–36.0)
MCV: 76 fL — ABNORMAL LOW (ref 80.0–100.0)
Monocytes Absolute: 0.4 10*3/uL (ref 0.1–1.0)
Monocytes Relative: 7 %
Neutro Abs: 4 10*3/uL (ref 1.7–7.7)
Neutrophils Relative %: 69 %
Platelets: 180 10*3/uL (ref 150–400)
RBC: 4.09 MIL/uL (ref 3.87–5.11)
RDW: 18.8 % — ABNORMAL HIGH (ref 11.5–15.5)
WBC: 5.8 10*3/uL (ref 4.0–10.5)
nRBC: 0 % (ref 0.0–0.2)

## 2019-11-01 LAB — CALCIUM, IONIZED: Calcium, Ionized, Serum: 5.6 mg/dL (ref 4.5–5.6)

## 2019-11-01 LAB — BASIC METABOLIC PANEL
Anion gap: 9 (ref 5–15)
BUN: 7 mg/dL — ABNORMAL LOW (ref 8–23)
CO2: 26 mmol/L (ref 22–32)
Calcium: 9.9 mg/dL (ref 8.9–10.3)
Chloride: 105 mmol/L (ref 98–111)
Creatinine, Ser: 0.68 mg/dL (ref 0.44–1.00)
GFR calc Af Amer: >60 mL/min (ref >60)
GFR calc non Af Amer: >60 mL/min (ref >60)
Glucose, Bld: 91 mg/dL (ref 70–99)
Potassium: 4.1 mmol/L (ref 3.5–5.1)
Sodium: 140 mmol/L (ref 135–145)

## 2019-11-01 LAB — PHOSPHORUS: Phosphorus: 2.2 mg/dL — ABNORMAL LOW (ref 2.5–4.6)

## 2019-11-01 LAB — MAGNESIUM: Magnesium: 1.8 mg/dL (ref 1.7–2.4)

## 2019-11-01 MED ORDER — METOPROLOL TARTRATE 50 MG PO TABS
100.0000 mg | ORAL_TABLET | Freq: Two times a day (BID) | ORAL | Status: DC
  Administered 2019-11-01: 100 mg via ORAL

## 2019-11-01 MED ORDER — FUROSEMIDE 20 MG PO TABS: 20.0000 mg | ORAL_TABLET | Freq: Every day | ORAL | Status: AC | PRN

## 2019-11-01 NOTE — Evaluation (Signed)
Speech Language Pathology Evaluation Patient Details Name: Lindsay Scott MRN: [id number] DOB: 03-03-1934 Today's Date: 11/01/2019 Time: 0240-9735 SLP Time Calculation (min) (ACUTE ONLY): 23 min  Problem List:  Patient Active Problem List   Diagnosis Date Noted  . Aphasia 10/30/2019  . Hypokalemia 10/30/2019  . Hypomagnesemia 10/30/2019  . S/P TAVR (transcatheter aortic valve replacement) 08/23/2019  . Essential hypertension 06/25/2019  . Hyperlipidemia, mixed 06/25/2019  . Nonrheumatic aortic valve stenosis 06/25/2019  . PAF (paroxysmal atrial fibrillation) (HCC) 06/25/2019  . Coronary artery disease involving native coronary artery of native heart without angina pectoris 06/25/2019   Past Medical History:  Past Medical History:  Diagnosis Date  . Atrial fibrillation (HCC)   . Coronary artery disease   . Hypertension   . Nonrheumatic aortic (valve) stenosis    Past Surgical History:  Past Surgical History:  Procedure Laterality Date  . TRANSCATHETER AORTIC VALVE REPLACEMENT, TRANSFEMORAL     HPI:  Lindsay Scott is a 85 y.o. female with a history of nonrheumatic aortic stenosis with history of transcatheter valve replacement, paroxysmal atrial fibrillation on chronic anticoagulation with Eliquis.  Patient seen for 10 days of slurred speech and dizziness.  Initially, her symptoms were mild, but they became gradually worse.  Initially, her family members thought it was due to her weight loss and her dentures not fitting well.  However, her symptoms became worse yesterday and even worse today where her family was having a difficult time understanding her.  Although they can make out some words, there are many other words that appear to be nonsensical in nature.  In talking with the patient, the patient indicates that she understands what is being said, and knows what she wants to say, however the words come out garbled.   Assessment / Plan / Recommendation Clinical Impression  Pt  presents with mild dysarthria characterized by some undershooting and overshooting of articulatory contacts, however speech is generally 95% intelligible. Pt's son reports that her speech has fluctuated for the past 10 days or so (PLEASE NOTE that Lexapro was increased from 5 mg to 10 mg ~10/11/19 per son). He confirms that her cognition appears to be at baseline. He also indicates that she appears to be gesturing with her hands more during speech. Pt typically wears U/L dentures, however she has not been wearing them recently due to "ulcers" that dentures seem to be causing and she had an appointment with her dentist but could not attend due to this hospitalization. MRI was negative for acute changes. No further acute SLP needs identified at this time, d/w Pt, son, and MD.     SLP Assessment  SLP Recommendation/Assessment: Patient does not need any further Speech Lanaguage Pathology Services SLP Visit Diagnosis: Dysarthria and anarthria (R47.1)    Follow Up Recommendations  None    Frequency and Duration           SLP Evaluation Cognition  Overall Cognitive Status: Within Functional Limits for tasks assessed Arousal/Alertness: Awake/alert Orientation Level: Oriented X4 Memory: Appears intact Awareness: Appears intact Problem Solving: Appears intact Safety/Judgment: Appears intact       Comprehension  Auditory Comprehension Overall Auditory Comprehension: Impaired Yes/No Questions: Within Functional Limits Commands: Impaired Two Step Basic Commands: 75-100% accurate Multistep Basic Commands: 75-100% accurate Conversation: Simple EffectiveTechniques: Repetition Visual Recognition/Discrimination Discrimination: Within Function Limits Reading Comprehension Reading Status: Not tested    Expression Expression Primary Mode of Expression: Verbal Verbal Expression Overall Verbal Expression: Appears within functional limits for tasks assessed Initiation:  No impairment Automatic  Speech: Name;Social Response Level of Generative/Spontaneous Verbalization: Conversation Repetition: No impairment Naming: No impairment Pragmatics: No impairment Interfering Components: Speech intelligibility Non-Verbal Means of Communication: Not applicable Written Expression Dominant Hand: Right Written Expression: Not tested   Oral / Motor  Oral Motor/Sensory Function Overall Oral Motor/Sensory Function: Within functional limits Motor Speech Overall Motor Speech: Impaired Respiration: Within functional limits Phonation: Normal Resonance: Within functional limits Articulation: Impaired Level of Impairment: Conversation Intelligibility: Intelligibility reduced Word: 75-100% accurate Phrase: 75-100% accurate Sentence: 75-100% accurate Conversation: 75-100% accurate Motor Planning: Witnin functional limits Motor Speech Errors: Aware;Unaware Interfering Components: Inadequate dentition Effective Techniques: Over-articulate   Thank you,  Havery Moros, CCC-SLP [phone]                     Aloura Matsuoka 11/01/2019, 2:38 PM

## 2019-11-01 NOTE — TOC Transition Note (Signed)
Transition of Care [facility]) - CM/SW Discharge Note   Patient Details  Name: Lindsay Scott MRN: [id number] Date of Birth: 15-Jun-1934  Transition of Care [facility]) CM/SW Contact:  Karn Cassis, LCSW Phone Number: 11/01/2019, 3:40 PM   Clinical Narrative:  Pt d/c today. Orders for home health RN are in. LCSW spoke with pt's son at bedside who is agreeable to referral to [facility]. Ref[facility] to [facility] who accepts and pt should be seen tomorrow. Ed updated. No other needs reported.      Final next level of care: Home w Home Health Services Barriers to Discharge: Barriers Resolved   Patient Goals and CMS Choice Patient states their goals for this hospitalization and ongoing recovery are:: return home      Discharge Placement                  Name of family member notified: Ed- son Patient and family notified of of transfer: 11/01/19  Discharge Plan and Services                          HH Arranged: RN [facility] Agency: [facility] Date HH Agency Contacted: 11/01/19 Time HH Agency Contacted: [phone] Representative spoke with at [facility] Agency: Alroy Bailiff  Social Determinants of Health (SDOH) Interventions     Readmission Risk Interventions No flowsheet data found.

## 2019-11-01 NOTE — Evaluation (Signed)
Occupational Therapy Evaluation Patient Details Name: Lindsay Scott MRN: [id number] DOB: 1934-05-04 Today's Date: 11/01/2019    History of Present Illness Lindsay Scott is a 85 y.o. female with a history of nonrheumatic aortic stenosis with history of transcatheter valve replacement, paroxysmal atrial fibrillation on chronic anticoagulation with Eliquis.  Patient seen for 10 days of slurred speech and dizziness.  Initially, her symptoms were mild, but they became gradually worse.  Initially, her family members thought it was due to her weight loss and her dentures not fitting well.  However, her symptoms became worse yesterday and even worse today where her family was having a difficult time understanding her.  Although they can make out some words, there are many other words that appear to be nonsensical in nature.  In talking with the patient, the patient indicates that she understands what is being said, and knows what she wants to say, however the words come out garbled.   Clinical Impression   Pt agreeable to participate in OT evaluation. Demonstrating expressive difficulties primarily as she is able to navigate around hospital room and complete basic ADL tasks without physical assistance needed. Did require supervision and VC for attention to task and to locate appropriate supplies. Pt lives with children who are able to assist PRN. At this time, patient does not require any follow up OT although do recommend that she continue to receive assistance from family members as needed at home. Thank you for the referral.     Follow Up Recommendations  Supervision - Intermittent;No OT follow up    Equipment Recommendations  None recommended by OT       Precautions / Restrictions Precautions Precautions: None Restrictions Weight Bearing Restrictions: No      Mobility Bed Mobility Overal bed mobility: Modified Independent    Transfers Overall transfer level: Modified independent Equipment  used: None        Balance Overall balance assessment: Mild deficits observed, not formally tested       ADL either performed or assessed with clinical judgement   ADL Overall ADL's : At baseline     General ADL Comments: Patient appears close to baseline while requiring supervision due to new environment and recommendations of supplies. pt was able to complete LB dressing while seating on EOB with supervision. No LOB. While standing at sink with supervision, patient complete grooming task such as washing her hands, washing her face, and combing her hair. No LOB noted.     Vision Baseline Vision/History:  (Pt reports some visual issue that her eye doctor is monitoring. Related to the optic nerve. Although no bad enough for glasses. Only monitoring. Pt was unable to provide a name.) Patient Visual Report: No change from baseline Additional Comments: Patient has difficulty reading small print especially in low light environment. Able to read the large print on shampoo/body wash bottle. Able to locate washcloth on sink and management the faucet.            Pertinent Vitals/Pain Pain Assessment: No/denies pain     Hand Dominance Right   Extremity/Trunk Assessment Upper Extremity Assessment Upper Extremity Assessment: Overall WFL for tasks assessed   Lower Extremity Assessment Lower Extremity Assessment: Defer to PT evaluation       Communication Communication Communication: Expressive difficulties   Cognition Arousal/Alertness: Awake/alert Behavior During Therapy: WFL for tasks assessed/performed Overall Cognitive Status: Difficult to assess      General Comments  When initially transitioning from sitting EOB to standing, patient did  have a slight wobble although able to self correct without needing additional assistance.            Home Living Family/patient expects to be discharged to:: Private residence Living Arrangements: Children Available Help at Discharge:  Family;Available PRN/intermittently Type of Home: House Home Access: Stairs to enter [employer] of Steps: 6-7 Entrance Stairs-Rails: Right;Left;Can reach both Home Layout: One level     Bathroom Shower/Tub: Producer, television/film/video: Standard Bathroom Accessibility: Yes   Home Equipment: Environmental consultant - 2 wheels          Prior Functioning/Environment Level of Independence: Independent with assistive device(s)        Comments: household ambulator using RW PRN                                  AM-PAC OT "6 Clicks" Daily Activity     Outcome Measure Help from another person eating meals?: None Help from another person taking care of personal grooming?: None Help from another person toileting, which includes using toliet, bedpan, or urinal?: A Little Help from another person bathing (including washing, rinsing, drying)?: A Little Help from another person to put on and taking off regular upper body clothing?: A Little Help from another person to put on and taking off regular lower body clothing?: A Little 6 Click Score: 20   End of Session    Activity Tolerance: Patient tolerated treatment well;Other (comment) (HR elevated while standing at sink (130). Nurse requested that patient sit or return to bed.) Patient left: in chair;with call bell/phone within reach;with chair alarm set  OT Visit Diagnosis: History of falling (Z91.81)                Time: 4259-5638 OT Time Calculation (min): 26 min Charges:  OT General Charges $OT Visit: 1 Visit OT Evaluation $OT Eval Low Complexity: 1 Low  Limmie Patricia, OTR/L,CBIS  [phone]   Chesnie Capell, Charisse March 11/01/2019, 10:01 AM

## 2019-11-01 NOTE — Plan of Care (Signed)
  Problem: Education: Goal: Knowledge of General Education information will improve Description Including pain rating scale, medication(s)/side effects and non-pharmacologic comfort measures Outcome: Progressing   Problem: Health Behavior/Discharge Planning: Goal: Ability to manage health-related needs will improve Outcome: Progressing   Problem: Education: Goal: Knowledge of disease or condition will improve Outcome: Progressing Goal: Knowledge of secondary prevention will improve Outcome: Progressing Goal: Knowledge of patient specific risk factors addressed and post discharge goals established will improve Outcome: Progressing   

## 2019-11-01 NOTE — Discharge Summary (Addendum)
Physician Discharge Summary  Lindsay Scott ZOX:[id number] DOB: 1934/09/16 DOA: 10/30/2019  PCP: System, Pcp Not In  Admit date: 10/30/2019 Discharge date: 11/01/2019  Admitted From: Home Discharge disposition: Home with home health RN   Code Status: Full Code  Diet Recommendation: Cardiac diet  Discharge Diagnosis:   Principal Problem:   Aphasia Active Problems:   Essential hypertension   Hyperlipidemia, mixed   Nonrheumatic aortic valve stenosis   PAF (paroxysmal atrial fibrillation) (HCC)   S/P TAVR (transcatheter aortic valve replacement)   Coronary artery disease involving native coronary artery of native heart without angina pectoris   Hypokalemia   Hypomagnesemia   History of Present Illness / Brief narrative:  Lindsay Scott is a 85 y.o. female with PMH of nonrheumatic aortic stenosis status post TAVR, paroxysmal A. fib on chronic anticoagulation with Eliquis, hypertension, CAD. Patient presented to the ED on 10/30/2019 with 10 days history of gradually worsening dizziness, slurred speech and difficulty articulating words.  In the ED, patient was afebrile, tachycardic mostly between 100 and 110. Blood work showed potassium level low at 2.9, magnesium at 1.5 CT scan of the head showed old microinfarcts. CT angio of head and neck did not show any large vessel occlusion but showed atherosclerotic change throughout the carotid siphons with associated mild to moderate multifocal stenoses.  Patient was admitted under hospitalist service for further evaluation and management  Subjective:  Seen and examined this morning.  Pleasant elderly female.  Not in distress.  No focal neurological deficit.  Speech improving.  Son at bedside.  Discussed with daughter on the phone.  Hospital Course:  Acute neurological symptoms - TIA vs stroke -patient was brought in with 10 days history of gradually worsening dizziness, slurred speech and difficulty articulating words. -Initially  suspected to have acute stroke.  However negative CT scan and CT angio of head and neck for acute findings. -Stroke work up ordered including MRI, Echo, A1c, lipid panel. -A1c 5.5 -MRI brain did not show any acute intracranial abnormality.   -Oriented x3.  Family continues to have concern about some word finding difficulties, flight of ideas.  Patient may be getting early onset dementia.   -I gave a referral for neurology evaluation as an outpatient. -Dizziness could be secondary to compromised cerebral perfusion due to overdiuresis in the setting of multifocal intracranial stenosis  Acute metabolic encephalopathy -Normal ammonia level.  Urine drug screen negative. -Patient had risk factors for dementia including advanced age, hypertension, CAD, A. fib Urine drug screen:    Component Value Date/Time   LABOPIA NONE DETECTED 10/30/2019 1916   COCAINSCRNUR NONE DETECTED 10/30/2019 1916   LABBENZ NONE DETECTED 10/30/2019 1916   AMPHETMU NONE DETECTED 10/30/2019 1916   THCU NONE DETECTED 10/30/2019 1916   LABBARB NONE DETECTED 10/30/2019 1916   Recent Labs  Lab 10/31/19 0755  AMMONIA 23  VITAMINB12 465  TSH 0.730     Hypercalcemia -Calcium level 10.4 on admission.  Improving. Recent Labs  Lab 10/30/19 1915 10/31/19 0755 11/01/19 0705  CALCIUM 10.4* 9.7 9.9   Severe hypokalemia/hypomagnesemia -Replaced.  Improved. Recent Labs  Lab 10/30/19 1915 10/31/19 0755 11/01/19 0705  K 2.9* 3.7 4.1  MG 1.5*  --  1.8   Essential hypertension -Home meds include Lopressor 100 mg twice daily, Lasix 20 mg daily -Continue metoprolol at the same dose.  Switch Lasix to 20 mg daily as needed.    Paroxysmal A. fib -Metoprolol resumed at a lower dose.  Continue Eliquis 5 mg  twice daily,  Depression -Continue Lexapro 10 mg daily.  Family states that Lexapro dose was increased from 5 mg to 10 mg recently.  It is unclear at this time if her symptoms are related to it.  But I do not think it  would hurt to reduce the dose or stop it for few days if family desires so.   Chronic anemia -Continue Prilosec 20 mg daily, iron supplement, folic acid supplement, -Seems to be on Mobic at home.  Keep it on hold. Recent Labs    10/30/19 1915 10/31/19 0755 11/01/19 0705  HGB 10.2*  --  8.8*  VITAMINB12  --  465  --    Aortic stenosis status post aortic valve replacement  Thyroid nodule -CT scan of neck showed 2.2 cm right thyroid nodule.    Thyroid ultrasound obtained.  Results pending.  Stable discharge home today with home health nurse.  Wound care:    Discharge Exam:   Vitals:   10/31/19 0700 10/31/19 0900 10/31/19 2153 11/01/19 0501  BP: 130/84 (!) 142/89 115/80 (!) 142/90  Pulse: (!) 104 (!) 120 (!) 103 (!) 104  Resp: 18 18 18 17   Temp: 98.6 F (37 C) 98 F (36.7 C) 97.9 F (36.6 C) (!) 97.1 F (36.2 C)  TempSrc:  Oral    SpO2: 98% 97% 100% 98%    There is no height or weight on file to calculate BMI.  General exam: Appears calm and comfortable.  Not in physical distress Skin: No rashes, lesions or ulcers. HEENT: Atraumatic, normocephalic, supple neck, no obvious bleeding Lungs: Clear to auscultation bilaterally CVS: Regular rate and rhythm, no murmur GI/Abd soft, nontender, nondistended, bowel sound present CNS: Alert, awake amount of x3 Psychiatry: Mood appropriate Extremities: No pedal edema, no calf tenderness  Follow ups:   Discharge Instructions    Ambulatory referral to Neurology   Complete by: As directed    An appointment is requested in approximately: 2 weeks   Diet - low sodium heart healthy   Complete by: As directed    Increase activity slowly   Complete by: As directed       Follow-up Information    [facility] Follow up.   Contact information: [address] [place] [address] [place] [phone]       [phone], MD Follow up in 2 week(s).   Specialty: Neurology Contact  information: [address] [place] [phone]               Recommendations for Outpatient Follow-Up:   1. Follow-up with PCP as an outpatient 2. Follow-up with neurology as outpatient  Discharge Instructions:  Follow with Primary MD System, Pcp Not In in 7 days   Get CBC/BMP checked in next visit within 1 week by PCP or SNF MD ( we routinely change or add medications that can affect your baseline labs and fluid status, therefore we recommend that you get the mentioned basic workup next visit with your PCP, your PCP may decide not to get them or add new tests based on their clinical decision)  On your next visit with your PCP, please Get Medicines reviewed and adjusted.  Please request your PCP  to go over all Hospital Tests and Procedure/Radiological results at the follow up, please get all Hospital records sent to your Prim MD by signing hospital release before you go home.  Activity: As tolerated with Full fall precautions use walker/cane & assistance as  needed  For Heart failure patients - Check your Weight same time everyday, if you gain over 2 pounds, or you develop in leg swelling, experience more shortness of breath or chest pain, call your Primary MD immediately. Follow Cardiac Low Salt Diet and 1.5 lit/day fluid restriction.  If you have smoked or chewed Tobacco in the last 2 yrs please stop smoking, stop any regular Alcohol  and or any Recreational drug use.  If you experience worsening of your admission symptoms, develop shortness of breath, life threatening emergency, suicidal or homicidal thoughts you must seek medical attention immediately by calling 911 or calling your MD immediately  if symptoms less severe.  You Must read complete instructions/literature along with all the possible adverse reactions/side effects for all the Medicines you take and that have been prescribed to you. Take any new Medicines after you have completely understood and  accpet all the possible adverse reactions/side effects.   Do not drive, operate heavy machinery, perform activities at heights, swimming or participation in water activities or provide baby sitting services if your were admitted for syncope or siezures until you have seen by Primary MD or a Neurologist and advised to do so again.  Do not drive when taking Pain medications.  Do not take more than prescribed Pain, Sleep and Anxiety Medications  Wear Seat belts while driving.   Please note You were cared for by a hospitalist during your hospital stay. If you have any questions about your discharge medications or the care you received while you were in the hospital after you are discharged, you can call the unit and asked to speak with the hospitalist on call if the hospitalist that took care of you is not available. Once you are discharged, your primary care physician will handle any further medical issues. Please note that NO REFILLS for any discharge medications will be authorized once you are discharged, as it is imperative that you return to your primary care physician (or establish a relationship with a primary care physician if you do not have one) for your aftercare needs so that they can reassess your need for medications and monitor your lab values.    Allergies as of 11/01/2019      Reactions   Betadine [povidone Iodine] Other (See Comments)   Latex Other (See Comments)   unkn   Penicillins Swelling   Sulfa Antibiotics Other (See Comments)   unkn      Medication List    STOP taking these medications   escitalopram 10 MG tablet Commonly known as: LEXAPRO   meloxicam 7.5 MG tablet Commonly known as: MOBIC     TAKE these medications   Eliquis 5 MG Tabs tablet Generic drug: apixaban Take 5 mg by mouth 2 (two) times daily.   ferrous sulfate 325 (65 FE) MG EC tablet Take 325 mg by mouth daily.   folic acid 1 MG tablet Commonly known as: FOLVITE Take 1 mg by mouth daily.    furosemide 20 MG tablet Commonly known as: LASIX Take 1 tablet (20 mg total) by mouth daily as needed. What changed:   when to take this  reasons to take this   metoprolol tartrate 100 MG tablet Commonly known as: LOPRESSOR Take 100 mg by mouth 2 (two) times daily.   omeprazole 20 MG capsule Commonly known as: PRILOSEC Take 20 mg by mouth daily before breakfast.   potassium chloride 10 MEQ tablet Commonly known as: KLOR-CON Take 10 mEq by mouth daily.  Vitamin D (Ergocalciferol) 1.25 MG (50000 UNIT) Caps capsule Commonly known as: DRISDOL Take 50,000 Units by mouth every Sunday.       Time coordinating discharge: 35 minutes  The results of significant diagnostics from this hospitalization (including imaging, microbiology, ancillary and laboratory) are listed below for reference.    Procedures and Diagnostic Studies:   CT ANGIO HEAD W OR WO CONTRAST  Result Date: 10/30/2019 CLINICAL DATA:  Initial evaluation for acute slurred speech, dizziness, blurry vision. EXAM: CT ANGIOGRAPHY HEAD AND NECK TECHNIQUE: Multidetector CT imaging of the head and neck was performed using the standard protocol during bolus administration of intravenous contrast. Multiplanar CT image reconstructions and MIPs were obtained to evaluate the vascular anatomy. Carotid stenosis measurements (when applicable) are obtained utilizing NASCET criteria, using the distal internal carotid diameter as the denominator. CONTRAST:  75mL OMNIPAQUE IOHEXOL 350 MG/ML SOLN COMPARISON:  CT prior head CT from earlier the same day. FINDINGS: CTA NECK FINDINGS Aortic arch: Visualized aortic arch of normal caliber with normal 3 vessel morphology. Moderate atheromatous change about the visualized arch and origin of the great vessels without high-grade stenosis. Right carotid system: Right CCA patent from its origin to the bifurcation without stenosis. Mild eccentric calcified plaque at the right bifurcation without  flow-limiting stenosis. Right ICA mildly tortuous but widely patent distally without stenosis, dissection or occlusion. Left carotid system: Left CCA widely patent proximally. Eccentric predominant noncalcified plaque at the mid-distal left CCA with no more than mild luminal narrowing. Centric calcified plaque at the left bifurcation without significant stenosis. Left ICA patent distally without stenosis, dissection or occlusion. Vertebral arteries: Both vertebral arteries arise from the subclavian arteries. No high-grade proximal subclavian artery stenosis. Both vertebral arteries patent within the neck without stenosis, dissection or occlusion. Right vertebral artery slightly dominant. Skeleton: No worrisome osseous lesions. Mild to moderate multilevel cervical spondylosis at C4-5 through C7-T1. patient is edentulous. Other neck: No other acute soft tissue abnormality within the neck. No adenopathy. Heterogeneous right thyroid nodule measuring 2.2 cm noted (series 4, image 44). Additional note made of an 11 mm right thyroid nodule as well. Upper chest: Visualized upper chest demonstrates no acute finding. Centrilobular emphysema noted. Review of the MIP images confirms the above findings CTA HEAD FINDINGS Anterior circulation: Evaluation of the intracranial circulation somewhat limited by motion artifact. Petrous segments patent bilaterally. Scattered atheromatous change throughout the carotid siphons with associated mild to moderate multifocal stenoses. A1 segments patent bilaterally. Right A1 hypoplastic. Normal anterior communicating artery complex. Anterior cerebral arteries patent to their distal aspects without stenosis. No appreciable M1 stenosis or occlusion, although evaluation limited by motion. There is an apparent 3 mm focal outpouching adjacent to the distal right M1 segment (series 6, image 101). While this finding is favored to be artifactual nature and/or possibly related to adjacent venous  contamination, a small aneurysm is difficult to exclude. MCA bifurcations M cells are grossly within normal limits. No proximal MCA branch occlusion. Distal MCA branches well perfused and symmetric. Posterior circulation: Mild non stenotic plaque noted within the V4 segments bilaterally. Neither PICA well visualized. Basilar widely patent to its distal aspect without stenosis. Superior cerebral arteries patent bilaterally. Both PCAs well perfused to their distal aspects without stenosis. Small bilateral posterior communicating arteries noted. Venous sinuses: Grossly patent allowing for timing the contrast bolus. Anatomic variants: Hypoplastic right A1 segment. Review of the MIP images confirms the above findings IMPRESSION: 1. Negative CTA for large vessel occlusion. 2. Atherosclerotic change throughout the carotid  siphons with associated mild to moderate multifocal stenoses. No other hemodynamically significant or correctable stenosis. 3. Apparent 3 mm focal outpouching adjacent to the distal right M1 segment. While this finding is favored to be artifactual in nature and/or possibly related to adjacent venous contamination, a small aneurysm is difficult to exclude. Attention at follow-up recommended. 4. 2.2 cm right thyroid nodule. Further assessment with dedicated thyroid ultrasound recommended for further evaluation. This could be performed on a nonemergent outpatient basis. (Ref: J Am Coll Radiol. 2015 Feb;12(2): 143-50). 5. Emphysema (ICD10-J43.9). Electronically Signed   By: Rise MuBenjamin  McClintock M.D.   On: 10/30/2019 23:58   CT HEAD WO CONTRAST  Result Date: 10/30/2019 CLINICAL DATA:  85 year old with slurred speech, dizziness, and blurred vision which began approximately 1-2 weeks ago with progressive worsening. Episode of choking on food last night. Weight loss since January, 2021. EXAM: CT HEAD WITHOUT CONTRAST TECHNIQUE: Contiguous axial images were obtained from the base of the skull through the  vertex without intravenous contrast. COMPARISON:  None. FINDINGS: Brain: Ventricular system normal in size and appearance for age. Moderate to marked bifrontal cortical atrophy and/or subdural hygromas with mild age related atrophy elsewhere. Mild changes of small vessel disease of the white matter diffusely. No mass lesion. No midline shift. No acute hemorrhage or hematoma. No extra-axial fluid collections. No evidence of acute infarction. Vascular: Moderate to severe BILATERAL carotid siphon and moderate BILATERAL vertebral artery atherosclerosis. No hyperdense vessel. Skull: No skull fracture or other focal osseous abnormality involving the skull. Sinuses/Orbits: Visualized paranasal sinuses, bilateral mastoid air cells and bilateral middle ear cavities well-aerated. Visualized orbits and globes normal in appearance. Other: None. IMPRESSION: 1. No acute intracranial abnormality. 2. Moderate to marked bifrontal cortical atrophy and/or subdural hygromas with mild age related atrophy elsewhere. 3. Mild chronic microvascular ischemic changes of the white matter diffusely. Electronically Signed   By: Hulan Saashomas  Lawrence M.D.   On: 10/30/2019 19:51   CT ANGIO NECK W OR WO CONTRAST  Result Date: 10/30/2019 CLINICAL DATA:  Initial evaluation for acute slurred speech, dizziness, blurry vision. EXAM: CT ANGIOGRAPHY HEAD AND NECK TECHNIQUE: Multidetector CT imaging of the head and neck was performed using the standard protocol during bolus administration of intravenous contrast. Multiplanar CT image reconstructions and MIPs were obtained to evaluate the vascular anatomy. Carotid stenosis measurements (when applicable) are obtained utilizing NASCET criteria, using the distal internal carotid diameter as the denominator. CONTRAST:  75mL OMNIPAQUE IOHEXOL 350 MG/ML SOLN COMPARISON:  CT prior head CT from earlier the same day. FINDINGS: CTA NECK FINDINGS Aortic arch: Visualized aortic arch of normal caliber with normal 3  vessel morphology. Moderate atheromatous change about the visualized arch and origin of the great vessels without high-grade stenosis. Right carotid system: Right CCA patent from its origin to the bifurcation without stenosis. Mild eccentric calcified plaque at the right bifurcation without flow-limiting stenosis. Right ICA mildly tortuous but widely patent distally without stenosis, dissection or occlusion. Left carotid system: Left CCA widely patent proximally. Eccentric predominant noncalcified plaque at the mid-distal left CCA with no more than mild luminal narrowing. Centric calcified plaque at the left bifurcation without significant stenosis. Left ICA patent distally without stenosis, dissection or occlusion. Vertebral arteries: Both vertebral arteries arise from the subclavian arteries. No high-grade proximal subclavian artery stenosis. Both vertebral arteries patent within the neck without stenosis, dissection or occlusion. Right vertebral artery slightly dominant. Skeleton: No worrisome osseous lesions. Mild to moderate multilevel cervical spondylosis at C4-5 through C7-T1. patient is edentulous. Other  neck: No other acute soft tissue abnormality within the neck. No adenopathy. Heterogeneous right thyroid nodule measuring 2.2 cm noted (series 4, image 44). Additional note made of an 11 mm right thyroid nodule as well. Upper chest: Visualized upper chest demonstrates no acute finding. Centrilobular emphysema noted. Review of the MIP images confirms the above findings CTA HEAD FINDINGS Anterior circulation: Evaluation of the intracranial circulation somewhat limited by motion artifact. Petrous segments patent bilaterally. Scattered atheromatous change throughout the carotid siphons with associated mild to moderate multifocal stenoses. A1 segments patent bilaterally. Right A1 hypoplastic. Normal anterior communicating artery complex. Anterior cerebral arteries patent to their distal aspects without stenosis.  No appreciable M1 stenosis or occlusion, although evaluation limited by motion. There is an apparent 3 mm focal outpouching adjacent to the distal right M1 segment (series 6, image 101). While this finding is favored to be artifactual nature and/or possibly related to adjacent venous contamination, a small aneurysm is difficult to exclude. MCA bifurcations M cells are grossly within normal limits. No proximal MCA branch occlusion. Distal MCA branches well perfused and symmetric. Posterior circulation: Mild non stenotic plaque noted within the V4 segments bilaterally. Neither PICA well visualized. Basilar widely patent to its distal aspect without stenosis. Superior cerebral arteries patent bilaterally. Both PCAs well perfused to their distal aspects without stenosis. Small bilateral posterior communicating arteries noted. Venous sinuses: Grossly patent allowing for timing the contrast bolus. Anatomic variants: Hypoplastic right A1 segment. Review of the MIP images confirms the above findings IMPRESSION: 1. Negative CTA for large vessel occlusion. 2. Atherosclerotic change throughout the carotid siphons with associated mild to moderate multifocal stenoses. No other hemodynamically significant or correctable stenosis. 3. Apparent 3 mm focal outpouching adjacent to the distal right M1 segment. While this finding is favored to be artifactual in nature and/or possibly related to adjacent venous contamination, a small aneurysm is difficult to exclude. Attention at follow-up recommended. 4. 2.2 cm right thyroid nodule. Further assessment with dedicated thyroid ultrasound recommended for further evaluation. This could be performed on a nonemergent outpatient basis. (Ref: J Am Coll Radiol. 2015 Feb;12(2): 143-50). 5. Emphysema (ICD10-J43.9). Electronically Signed   By: Rise Mu M.D.   On: 10/30/2019 23:58     Labs:   Basic Metabolic Panel: Recent Labs  Lab 10/30/19 1915 10/30/19 1915 10/31/19 0755  11/01/19 0705  NA 140  --  140 140  K 2.9*   < > 3.7 4.1  CL 99  --  105 105  CO2 28  --  26 26  GLUCOSE 97  --  93 91  BUN 11  --  10 7*  CREATININE 0.65  --  0.68 0.68  CALCIUM 10.4*  --  9.7 9.9  MG 1.5*  --   --  1.8  PHOS  --   --   --  2.2*   < > = values in this interval not displayed.   GFR CrCl cannot be calculated (Unknown ideal weight.). Liver Function Tests: Recent Labs  Lab 10/30/19 1915 10/31/19 0755  AST 19 18  ALT 15 13  ALKPHOS 100 87  BILITOT 1.2 1.4*  PROT 7.1 6.4*  ALBUMIN 3.6 3.2*   No results for input(s): LIPASE, AMYLASE in the last 168 hours. Recent Labs  Lab 10/31/19 0755  AMMONIA 23   Coagulation profile Recent Labs  Lab 10/30/19 1915  INR 1.7*    CBC: Recent Labs  Lab 10/30/19 1915 11/01/19 0705  WBC 8.5 5.8  NEUTROABS 6.4 4.0  HGB 10.2* 8.8*  HCT 34.4* 31.1*  MCV 74.0* 76.0*  PLT 203 180   Cardiac Enzymes: No results for input(s): CKTOTAL, CKMB, CKMBINDEX, TROPONINI in the last 168 hours. BNP: Invalid input(s): POCBNP CBG: No results for input(s): GLUCAP in the last 168 hours. D-Dimer No results for input(s): DDIMER in the last 72 hours. Hgb A1c Recent Labs    10/31/19 0755  HGBA1C 5.5   Lipid Profile Recent Labs    10/31/19 0755  CHOL 142  HDL 29*  LDLCALC 94  TRIG 97  CHOLHDL 4.9   Thyroid function studies Recent Labs    10/31/19 0755  TSH 0.730   Anemia work up Recent Labs    10/31/19 0755  VITAMINB12 465   Microbiology Recent Results (from the past 240 hour(s))  SARS Coronavirus 2 by RT PCR (hospital order, performed in [facility] hospital lab) Nasopharyngeal Nasopharyngeal Swab     Status: None   Collection Time: 10/30/19  8:19 PM   Specimen: Nasopharyngeal Swab  Result Value Ref Range Status   SARS Coronavirus 2 NEGATIVE NEGATIVE Final    Comment: (NOTE) SARS-CoV-2 target nucleic acids are NOT DETECTED.  The SARS-CoV-2 RNA is generally detectable in upper and lower respiratory  specimens during the acute phase of infection. The lowest concentration of SARS-CoV-2 viral copies this assay can detect is 250 copies / mL. A negative result does not preclude SARS-CoV-2 infection and should not be used as the sole basis for treatment or other patient management decisions.  A negative result may occur with improper specimen collection / handling, submission of specimen other than nasopharyngeal swab, presence of viral mutation(s) within the areas targeted by this assay, and inadequate number of viral copies (<250 copies / mL). A negative result must be combined with clinical observations, patient history, and epidemiological information.  Fact Sheet for Patients:   BoilerBrush.com.cy  Fact Sheet for Healthcare Providers: https://pope.com/  This test is not yet approved or  cleared by the [place] FDA and has been authorized for detection and/or diagnosis of SARS-CoV-2 by FDA under an Emergency Use Authorization (EUA).  This EUA will remain in effect (meaning this test can be used) for the duration of the COVID-19 declaration under Section 564(b)(1) of the Act, 21 U.S.C. section 360bbb-3(b)(1), unless the authorization is terminated or revoked sooner.  Performed at [facility], [address] [place], [place] [address]      Signed: Lorin Glass  [facility] 11/01/2019, 3:31 PM

## 2019-11-09 ENCOUNTER — Ambulatory Visit (INDEPENDENT_AMBULATORY_CARE_PROVIDER_SITE_OTHER): Payer: Medicare Other | Admitting: Neurology

## 2019-11-09 VITALS — BP 124/84

## 2019-11-09 DIAGNOSIS — R413 Other amnesia: Secondary | ICD-10-CM

## 2019-11-09 DIAGNOSIS — G459 Transient cerebral ischemic attack, unspecified: Secondary | ICD-10-CM | POA: Diagnosis not present

## 2019-11-09 DIAGNOSIS — F0390 Unspecified dementia without behavioral disturbance: Secondary | ICD-10-CM | POA: Diagnosis not present

## 2019-11-09 MED ORDER — MEMANTINE HCL 28 X 5 MG & 21 X 10 MG PO TABS: ORAL_TABLET | ORAL | 12 refills | Status: DC

## 2019-11-09 NOTE — Progress Notes (Signed)
[name] [address] [place]. [place] [address] [phone]       OFFICE CONSULT NOTE  Ms. AUTUM BENFER Date of Birth:  [birth year] Medical Record Number:  [id number]   Referring MD:  Lorin Glass  Reason for Referral: Aphasia  HPI: Ms. Ketron is a pleasant 85 year old Caucasian lady seen today for initial office consultation visit for aphasia.  She is accompanied by her daughter.  History is obtained from them, review of electronic medical records and I personally reviewed pertinent available imaging films in PACS.  She has past medical history of nonrheumatic aortic stenosis, s/p TAVR, paroxysmal A. fib on chronic anticoagulation with Eliquis, hypertension, coronary artery disease.  She presented to [facility] on 10/30/2019 with a 10-day history of worsening speech and difficulty articulating and dizziness.  Lab work was significant only for low potassium of 2.9 and CT scan of the head showed no acute abnormality and CT angiogram showed no significant large vessel intracranial extracranial stenosis.  MRI scan of the brain was also obtained which showed no acute abnormalities.  LDL cholesterol 94 mg percent hemoglobin A1c was 5.5.  B12 and TSH were normal.  Echo was not done.  Patient had been started on Lexapro which was discontinued in the hospital and her speech difficulties improved.  However the daughter feels that she has noticed significant cognitive decline and memory difficulties since then.  On inquiry she states feels that this has been going on but has clearly worsened in the last 1 month.  Patient requires information to be given multiple times since she still forgets.  She is required more help with activities of daily living recently.  She does also occasionally get dizzy and feels avoid a straight into the right.  She is also seeing shadows in her room particularly at nighttime.  On Mini-Mental status exam today she scored 20/30 in the office and then  significant problems with visual-spatial skills, animal naming and drawing a clock.  She is not had any work-up for reversible causes of memory loss or treatment for Alzheimer's.  There is no family stable tremors.  Patient has no prior history of known stroke, TIA, seizures, significant head injury with loss of consciousness.  She has not exhibited any unsafe behavior.  She denies any delusions.  She is mostly calm and well behaved but does get irritable and agitated at times but there is no violent behavior.  She is not demonstrated tendency to wander off.  She does not drive.  ROS:   14 system review of systems is positive for speech difficulties, memory loss, cognitive decline, agitation, irritability and all other systems negative PMH:  Past Medical History:  Diagnosis Date  . Atrial fibrillation (HCC)   . Coronary artery disease   . Hypertension   . Nonrheumatic aortic (valve) stenosis     Social History:  Social History   Socioeconomic History  . Marital status: Widowed    Spouse name: Not on file  . Number of children: Not on file  . Years of education: Not on file  . Highest education level: Not on file  Occupational History  . Not on file  Tobacco Use  . Smoking status: Not on file  Substance and Sexual Activity  . Alcohol use: Not on file  . Drug use: Not on file  . Sexual activity: Not on file  Other Topics Concern  . Not on file  Social History Narrative  . Not on file  Social Determinants of Health   Financial Resource Strain:   . Difficulty of Paying Living Expenses: Not on file  Food Insecurity:   . Worried About Programme researcher, broadcasting/film/video in the Last Year: Not on file  . Ran Out of Food in the Last Year: Not on file  Transportation Needs:   . Lack of Transportation (Medical): Not on file  . Lack of Transportation (Non-Medical): Not on file  Physical Activity:   . Days of Exercise per Week: Not on file  . Minutes of Exercise per Session: Not on file  Stress:    . Feeling of Stress : Not on file  Social Connections:   . Frequency of Communication with Friends and Family: Not on file  . Frequency of Social Gatherings with Friends and Family: Not on file  . Attends Religious Services: Not on file  . Active Member of Clubs or Organizations: Not on file  . Attends Banker Meetings: Not on file  . Marital Status: Not on file  Intimate Partner Violence:   . Fear of Current or Ex-Partner: Not on file  . Emotionally Abused: Not on file  . Physically Abused: Not on file  . Sexually Abused: Not on file    Medications:   Current Outpatient Medications on File Prior to Visit  Medication Sig Dispense Refill  . apixaban (ELIQUIS) 5 MG TABS tablet Take 5 mg by mouth 2 (two) times daily.    Marland Kitchen escitalopram (LEXAPRO) 10 MG tablet Take by mouth.    . ferrous sulfate 325 (65 FE) MG EC tablet Take 325 mg by mouth daily.    . folic acid (FOLVITE) 1 MG tablet Take 1 mg by mouth daily.    . furosemide (LASIX) 20 MG tablet Take 1 tablet (20 mg total) by mouth daily as needed. 30 tablet   . meloxicam (MOBIC) 7.5 MG tablet 1 tablet 2 (two) times daily    . metoprolol tartrate (LOPRESSOR) 100 MG tablet Take 100 mg by mouth 2 (two) times daily.    Marland Kitchen omeprazole (PRILOSEC) 20 MG capsule Take 20 mg by mouth daily before breakfast.    . potassium chloride (KLOR-CON) 10 MEQ tablet Take 10 mEq by mouth daily.    . Vitamin D, Ergocalciferol, (DRISDOL) 1.25 MG (50000 UNIT) CAPS capsule Take 50,000 Units by mouth every Sunday.     No current facility-administered medications on file prior to visit.    Allergies:   Allergies  Allergen Reactions  . Betadine [Povidone Iodine] Other (See Comments)  . Latex Other (See Comments)    unkn  . Penicillins Swelling  . Sulfa Antibiotics Other (See Comments)    unkn    Physical Exam General: Frail elderly Caucasian lady seated, in no evident distress Head: head normocephalic and atraumatic.   Neck: supple with no  carotid or supraclavicular bruits Cardiovascular: regular rate and rhythm, no murmurs Musculoskeletal: no deformity Skin:  no rash/petichiae Vascular:  Normal pulses all extremities  Neurologic Exam Mental Status: Awake and fully alert. Oriented to place and time. Recent and remote memory poor. Attention span, concentration and fund of knowledge diminished. Mood and affect appropriate.  Mini-Mental status exam she scored 20/30 with deficits in orientation, attention, calculation, recall.  She had difficulty with copying intersecting pentagons.  She was unable to name any animals which walk on 4 legs or draw clock..  Geriatric depression scale she scored 6 suggestive of mild depression. Cranial Nerves: Fundoscopic exam reveals sharp disc margins. Pupils equal,  briskly reactive to light. Extraocular movements full without nystagmus. Visual fields full to confrontation. Hearing intact. Facial sensation intact. Face, tongue, palate moves normally and symmetrically.  Motor: Normal bulk and tone. Normal strength in all tested extremity muscles.  Mild action tremor of outstretched upper extremities.  No cogwheel rigidity.  No bradykinesia. Sensory.: intact to touch , pinprick , position and vibratory sensation.  Coordination: Rapid alternating movements normal in all extremities. Finger-to-nose and heel-to-shin performed accurately bilaterally. Gait and Station: Arises from chair without difficulty. Stance is slightly stooped. Gait demonstrates normal stride length and balance . Able to heel, toe and tandem walk with moderate difficulty.  Reflexes: 1+ and symmetric. Toes downgoing.      ASSESSMENT: 85 year old Caucasian lady with progressive memory and cognitive and speech issues for the last month likely from underlying mild Alzheimer's dementia and with speech difficulties that had transiently worsened after medication effect of Lexapro.     PLAN: I had a long discussion with the patient and her  daughter regarding her symptoms of subacute cognitive decline and memory worsening likely represents dementia though treatable causes need to be ruled out.  I recommend further evaluation by checking memory panel labs, EEG.  Trial of Namenda starter pack to be increased as tolerated.  Patient will need 24-hour supervision at home and is now moved in with her daughter.  Greater than 50% time during this 45-minute consultation was it was spent on counseling and coordination of care about her speech and memory and cognitive difficulties and diagnosis of Alzheimer's and answering questions she will return for follow-up in 2 months or call earlier if necessary. Delia Heady, MD  [facility] [address] [place], [place] [address]  Phone [phone] Fax [phone] Note: This document was prepared with digital dictation and possible smart phrase technology. Any transcriptional errors that result from this process are unintentional.

## 2019-11-09 NOTE — Patient Instructions (Signed)
I had a long discussion with the patient and her daughter regarding her symptoms of subacute cognitive decline and memory worsening likely represents dementia though treatable causes need to be ruled out.  I recommend further evaluation by checking memory panel labs, EEG.  Trial of Namenda starter pack to be increased as tolerated.  Patient will need 24-hour supervision at home and is now moved in with her daughter.  She will return for follow-up in 2 months or call earlier if necessary.  Alzheimer Disease Caregiver Guide  Alzheimer disease causes a person to lose the ability to remember things and make decisions. A person who has Alzheimer disease may not be able to take care of himself or herself. He or she may need help with simple tasks. The tips below can help you care for the person. What kind of changes does this condition cause? This condition makes a person:  Forget things.  Feel confused.  Act differently.  Have different moods. These things get worse with time. Tips to help with symptoms  Be calm and patient.  Respond with a simple, short answer.  Avoid correcting the person in a negative way.  Try not to take things personally, even if the person forgets your name.  Do not argue with the person. This may make the person more upset. Tips to lessen frustration  Make appointments and do daily tasks when the person is at his or her best.  Take your time. Simple tasks may take longer. Allow plenty of time to complete tasks.  Limit choices for the person.  Involve the person in what you are doing.  Keep a daily routine.  Avoid new or crowded places, if possible.  Use simple words, short sentences, and a calm voice. Only give one direction at a time.  Buy clothes and shoes that are easy to put on and take off.  Organize medicines in a pillbox for each day of the week.  Keep a calendar in a central location to remind the person of meetings or other  activities.  Let people help if they offer. Take a break when needed. Tips to prevent injury  Keep floors clear. Remove rugs, magazine racks, and floor lamps.  Keep hallways well-lit.  Put a handrail and non-slip mat in the bathtub or shower.  Put childproof locks on cabinets that have dangerous items in them. These items include medicine, alcohol, guns, toxic cleaning items, sharp tools, matches, and lighters.  Put locks on doors where the person cannot see or reach them. This helps the person to not wander out of the house and get lost.  Be prepared for emergencies. Keep a list of emergency phone numbers and addresses close by.  Bracelets may be worn that track location and identify the person as having memory problems. This should be worn at all times for safety. Tips for the future  Discuss financial and legal planning early. People with this disease have trouble managing their money as the disease gets worse. Get help from a professional.  Talk about advance directives, safety, and daily care. Take these steps: ? Create a living will and choose a power of attorney. This is someone who can make decisions for the person with Alzheimer disease when he or she can no longer do so. ? Discuss driving safety and when to stop driving. The person's doctor can help with this. ? If the person lives alone, make sure he or she is safe. Some people need extra help at home.  Other people need more care at a nursing home or care center. Where to find support You can find support by joining a support group near you. Some benefits of joining a support group include:  Learning ways to manage stress.  Sharing experiences with others.  Getting emotional comfort and support.  Learning about caregiving as the disease progresses.  Knowing what community resources are available and making use of them. Where to find more information  Alzheimer's Association: [employer] Contact a doctor if:  The  person has a fever.  The person has a sudden behavior change that does not get better with calming strategies.  The person is not able to take care of himself or herself at home.  The person threatens you or anyone else, including himself or herself.  You are no longer able to care for the person. Summary  Alzheimer disease causes a person to forget things and to be confused.  A person who has this condition may not be able to take care of himself or herself.  Take steps to keep the person from getting hurt. Plan for future care.  You can find support by joining a support group near you. This information is not intended to replace advice given to you by your health care provider. Make sure you discuss any questions you have with your health care provider. Document Revised: 05/26/2018 Document Reviewed: 01/30/2017 Elsevier Patient Education  2020 [employer].

## 2019-11-10 LAB — DEMENTIA PANEL
Homocysteine: 20.4 umol/L (ref 0.0–21.3)
RPR Ser Ql: NONREACTIVE
TSH: 2.24 u[IU]/mL (ref 0.450–4.500)
Vitamin B-12: 793 pg/mL (ref 232–1245)

## 2019-11-10 NOTE — Progress Notes (Signed)
Kindly inform the patient that lab work for reversible causes of memory loss was all normal

## 2019-11-11 ENCOUNTER — Telehealth: Payer: Self-pay | Admitting: *Deleted

## 2019-11-11 NOTE — Telephone Encounter (Signed)
Spoke with daughter, on [place] and informed her the patient's lab work for reversible causes of memory loss was all normal.  She verbalized understanding, appreciation.

## 2019-11-29 ENCOUNTER — Other Ambulatory Visit: Payer: Self-pay

## 2019-11-29 ENCOUNTER — Other Ambulatory Visit (INDEPENDENT_AMBULATORY_CARE_PROVIDER_SITE_OTHER): Payer: Medicare Other

## 2019-11-29 DIAGNOSIS — R410 Disorientation, unspecified: Secondary | ICD-10-CM

## 2019-11-30 ENCOUNTER — Encounter: Payer: Self-pay | Admitting: Nurse Practitioner

## 2019-11-30 ENCOUNTER — Ambulatory Visit (INDEPENDENT_AMBULATORY_CARE_PROVIDER_SITE_OTHER): Payer: Medicare Other | Admitting: "Endocrinology

## 2019-11-30 ENCOUNTER — Other Ambulatory Visit: Payer: Self-pay

## 2019-11-30 VITALS — BP 141/90 | HR 108 | Ht 59.0 in | Wt 141.4 lb

## 2019-11-30 DIAGNOSIS — E042 Nontoxic multinodular goiter: Secondary | ICD-10-CM | POA: Insufficient documentation

## 2019-11-30 NOTE — Progress Notes (Signed)
Endocrinology Consult Note                                            11/30/2019, 5:38 PM   Subjective:    Patient ID: Lindsay Scott, female    DOB: 1934-11-22, PCP Craig Staggers, MD   Past Medical History:  Diagnosis Date  . Atrial fibrillation (HCC)   . Coronary artery disease   . Hypertension   . Nonrheumatic aortic (valve) stenosis    Past Surgical History:  Procedure Laterality Date  . TRANSCATHETER AORTIC VALVE REPLACEMENT, TRANSFEMORAL     Social History   Socioeconomic History  . Marital status: Widowed    Spouse name: Not on file  . Number of children: Not on file  . Years of education: Not on file  . Highest education level: Not on file  Occupational History  . Not on file  Tobacco Use  . Smoking status: Former Smoker    Quit date: 2003    Years since quitting: 18.7  . Smokeless tobacco: Never Used  Substance and Sexual Activity  . Alcohol use: Not Currently  . Drug use: Never  . Sexual activity: Not on file  Other Topics Concern  . Not on file  Social History Narrative  . Not on file   Social Determinants of Health   Financial Resource Strain:   . Difficulty of Paying Living Expenses: Not on file  Food Insecurity:   . Worried About Programme researcher, broadcasting/film/video in the Last Year: Not on file  . Ran Out of Food in the Last Year: Not on file  Transportation Needs:   . Lack of Transportation (Medical): Not on file  . Lack of Transportation (Non-Medical): Not on file  Physical Activity:   . Days of Exercise per Week: Not on file  . Minutes of Exercise per Session: Not on file  Stress:   . Feeling of Stress : Not on file  Social Connections:   . Frequency of Communication with Friends and Family: Not on file  . Frequency of Social Gatherings with Friends and Family: Not on file  . Attends Religious Services: Not on file  . Active Member of Clubs or Organizations: Not on file  . Attends Banker Meetings: Not on file  .  Marital Status: Not on file   History reviewed. No pertinent family history. Outpatient Encounter Medications as of 11/30/2019  Medication Sig  . apixaban (ELIQUIS) 5 MG TABS tablet Take 5 mg by mouth 2 (two) times daily.  . ferrous sulfate 325 (65 FE) MG EC tablet Take 325 mg by mouth daily.  . folic acid (FOLVITE) 1 MG tablet Take 1 mg by mouth daily.  . furosemide (LASIX) 20 MG tablet Take 1 tablet (20 mg total) by mouth daily as needed.  . meloxicam (MOBIC) 7.5 MG tablet 1 tablet 2 (two) times daily  . memantine (NAMENDA TITRATION PAK) tablet pack 5 mg/day for =1 week; 5 mg twice daily for =1 week; 15 mg/day given in 5 mg and 10 mg separated doses for =1 week; then 10 mg twice daily  . metoprolol tartrate (LOPRESSOR) 100 MG tablet Take 100 mg by mouth 2 (two) times daily.  Marland Kitchen omeprazole (PRILOSEC) 20 MG capsule Take 20 mg by mouth daily before breakfast.  . potassium chloride (KLOR-CON) 10 MEQ tablet Take 10  mEq by mouth daily.  . Vitamin D, Ergocalciferol, (DRISDOL) 1.25 MG (50000 UNIT) CAPS capsule Take 50,000 Units by mouth every Sunday.  . [DISCONTINUED] escitalopram (LEXAPRO) 10 MG tablet Take by mouth.   No facility-administered encounter medications on file as of 11/30/2019.   ALLERGIES: Allergies  Allergen Reactions  . Betadine [Povidone Iodine] Other (See Comments)  . Latex Other (See Comments)    unkn  . Penicillins Swelling  . Sulfa Antibiotics Other (See Comments)    unkn    VACCINATION STATUS: Immunization History  Administered Date(s) Administered  . Moderna SARS-COVID-2 Vaccination 03/22/2019, 04/19/2019    HPI Lindsay Scott is 86 y.o. female who presents today with a medical history as above. she is accompanied by her son.  History is obtained mainly from chart review.  She is being seen in consultation for nodular goiter requested by Craig Staggers, MD.  She is known to have nodular goiter based on the CT scan finding in September 2021.  She was  subsequently sent for thyroid ultrasound which was completed on November 01, 2019 and revealed 4.6 cm right lobe with 2 nodules measuring 0.7 cm in 2.6 cm.  Left lobe was 3.2 cm.  Patient is a poor historian.  She does not have compressive symptoms.   There is no report of palpitations, tremors, nor heat/cold intolerance.  Her thyroid function test on 2 separate occasions in September were within normal limits.   -No recent weight change was appreciated/reported by the patient.  She has medical history of hypertension, coronary artery disease, atrial fibrillation.  She is on Eliquis.  Review of Systems  Constitutional: no recent weight gain/loss, no fatigue, no subjective hyperthermia, no subjective hypothermia Eyes: no blurry vision, no xerophthalmia ENT: no sore throat, no nodules palpated in throat, no dysphagia/odynophagia, no hoarseness Cardiovascular: no Chest Pain, no Shortness of Breath, no palpitations, no leg swelling Respiratory: no cough, no shortness of breath Gastrointestinal: no Nausea/Vomiting/Diarhhea Musculoskeletal: no muscle/joint aches Skin: no rashes Neurological: no tremors, no numbness, no tingling, no dizziness Psychiatric: no depression, no anxiety  Objective:    Vitals with BMI 11/30/2019 11/09/2019 11/01/2019  Height 4\' 11"  - -  Weight 141 lbs 6 oz - -  BMI 28.54 - -  Systolic 141 124  Diastolic 90 84 90  Pulse 108 - 104    BP (!) 141/90 (BP Location: Right Arm, Patient Position: Sitting)   Pulse (!) 108   Ht 4\' 11"  (1.499 m)   Wt 141 lb 6.4 oz (64.1 kg)   BMI 28.56 kg/m   Wt Readings from Last 3 Encounters:  11/30/19 141 lb 6.4 oz (64.1 kg)    Physical Exam  Constitutional:  Body mass index is 28.56 kg/m.,  not in acute distress, normal state of mind Eyes: PERRLA, EOMI, no exophthalmos ENT: moist mucous membranes, no gross thyromegaly, no gross cervical lymphadenopathy Cardiovascular: normal precordial activity, Regular Rate and Rhythm, no  Murmur/Rubs/Gallops Respiratory:  adequate breathing efforts, no gross chest deformity, Clear to auscultation bilaterally Gastrointestinal: abdomen soft, Non -tender, No distension, Bowel Sounds present, no gross organomegaly Musculoskeletal: no gross deformities, strength intact in all four extremities Skin: moist, warm, no rashes Neurological:   + Hard of hearing, no tremor with outstretched hands, Deep tendon reflexes normal in bilateral lower extremities.  CMP ( most recent) CMP     Component Value Date/Time   NA 140 11/01/2019 0705   K 4.1 11/01/2019 0705   CL 105 11/01/2019 0705   CO2  26 11/01/2019 0705   GLUCOSE 91 11/01/2019 0705   BUN 7 (L) 11/01/2019 0705   CREATININE 0.68 11/01/2019 0705   CALCIUM 9.9 11/01/2019 0705   PROT 6.4 (L) 10/31/2019 0755   ALBUMIN 3.2 (L) 10/31/2019 0755   AST 18 10/31/2019 0755   ALT 13 10/31/2019 0755   ALKPHOS 87 10/31/2019 0755   BILITOT 1.4 (H) 10/31/2019 0755   GFRNONAA >60 11/01/2019 0705   GFRAA >60 11/01/2019 0705     Diabetic Labs (most recent): Lab Results  Component Value Date   HGBA1C 5.5 10/31/2019     Lipid Panel ( most recent) Lipid Panel     Component Value Date/Time   CHOL 142 10/31/2019 0755   TRIG 97 10/31/2019 0755   HDL 29 (L) 10/31/2019 0755   CHOLHDL 4.9 10/31/2019 0755   VLDL 19 10/31/2019 0755   LDLCALC 94 10/31/2019 0755      Lab Results  Component Value Date   TSH 2.240 11/09/2019   TSH 0.730 10/31/2019    Thyroid ultrasound from November 01, 2019: Right lobe 4.6 x 2.3 x 1.8 cm, with 2 nodules measuring 0.7 cm and 2.6 cm.  Left lobe measuring 3.2 x 1.6 x 0.7 cm.  No discrete nodules.   Assessment & Plan:   1. Multinodular goiter  - Taetum Flewellen  is being seen at a kind request of Vasireddy, Sharyn Lull, MD. - I have reviewed her available thyroid records and clinically evaluated the patient. - Based on these reviews, she has multinodular goiter, with the largest nodule in the right  lobe measuring 2.6 cm is reported to be suspicious.  She is approached for fine-needle aspiration biopsy.  For this procedure, she is advised to hold her Eliquis for 2 days to facilitate the biopsy process.  She will return in 2 weeks with her biopsy results.  Her thyroid function tests are within normal limits, will not need intervention with thyroid hormone or antithyroid prescriptions.  - I did not initiate any new prescriptions today. - she is advised to maintain close follow up with Craig Staggers, MD for primary care needs.   - Time spent with the patient: 45 minutes, of which >50% was spent in  counseling her about her multinodular goiter and the rest in obtaining information about her symptoms, reviewing her previous labs/studies ( including abstractions from other facilities),  evaluations, and treatments,  and developing a plan to confirm diagnosis and long term treatment based on the latest standards of care/guidelines; and documenting her care.  Lindsay Scott participated in the discussions, expressed understanding, and voiced agreement with the above plans.  All questions were answered to her satisfaction. she is encouraged to contact clinic should she have any questions or concerns prior to her return visit.  Follow up plan: Return in about 2 weeks (around 12/14/2019) for F/U with Biopsy Results.   Marquis Lunch, MD [facility] [address] [place], [place] [address] Phone: [phone]  Fax: [phone]     11/30/2019, 5:38 PM  This note was partially dictated with voice recognition software. Similar sounding words can be transcribed inadequately or may not  be corrected upon review.

## 2019-12-01 ENCOUNTER — Encounter (HOSPITAL_COMMUNITY): Payer: Self-pay

## 2019-12-01 NOTE — Progress Notes (Unsigned)
Lindsay Scott. [place] Female, 85 y.o., [birth year] MRN:  [id number] Phone:  [phone] (H) PCP:  Craig Staggers, MD Coverage:  Medicare/Medicare Part A And B Next Appt With Radiology (AP-US 1) 12/08/2019 at 11:00 AM  RE: Blood Thinners Received: Yesterday Message Details  Roma Kayser, MD  Lillia [place] E She can hold off eliquis for 2 days for biopsy. Thank you for reaching out.   Previous Messages  ----- Message -----  From: Cory Munch  Sent: 11/30/2019 10:55 AM EDT  To: Roma Kayser, MD  Subject: Blood Thinners                  Hello   Pt. Lindsay Scott is on the Blood thinner Eliquis 2X's a day per her Daughter.  She needs to be off for two days prior to biopsy.  Permission is needed.   Tiffancy Moger

## 2019-12-06 ENCOUNTER — Telehealth: Payer: Self-pay

## 2019-12-06 NOTE — Telephone Encounter (Signed)
Answered and discussed pt's daughter's questions regarding biopsy.

## 2019-12-06 NOTE — Telephone Encounter (Signed)
Patient's daughter, Suan Halter is asking for a call back. She has questions about her mother's biopsy. [phone]

## 2019-12-07 ENCOUNTER — Telehealth: Payer: Self-pay | Admitting: Neurology

## 2019-12-07 NOTE — Progress Notes (Signed)
Kindly inform the patient that EEG study shows mild slowing of brain activity throughout which is of nonspecific finding seen in old age or patients with memory problems.  No seizures or worrisome finding

## 2019-12-07 NOTE — Telephone Encounter (Signed)
Called and spoke with pt daughter. Relayed results per Dr. Pearlean Brownie note.She verbalized understanding.  She states her mother took Namenda titration pack for 3 weeks. She is starting to taper her off of this because it is causing visual hallucinations, severe dry mouth and rash. She states she seems drunk on medication. Has a follow up on 01/18/20 for 2 month f/u. She is wanting to know what further recommendations Dr. Pearlean Brownie has. Wanting to know what is causing sx. Also wanting to know what severity of dementia she has. Advised I will send message to [facility] and we will call back.

## 2019-12-07 NOTE — Telephone Encounter (Signed)
Pt's daughter called wanting to know if the pt's EEG results have come in. Please advise.

## 2019-12-07 NOTE — Telephone Encounter (Signed)
Per Dr. Marlis Edelson note: "Kindly inform the patient that EEG study shows mild slowing of brain activity throughout which is of nonspecific finding seen in old age or patients with memory problems.  No seizures or worrisome finding"

## 2019-12-07 NOTE — Telephone Encounter (Signed)
I called and spoke to the patient's daughter who was concerned that the patient was not tolerating Namenda and was drunk and having increased nightmares and hallucinations.  I recommend she taper the Namenda to 5 mg twice daily for 3 days then 5 mg once a day for 3 days and stop.  She was advised to call back after that.  If needed we could switch her to Aricept in the future.  She voiced understanding.

## 2019-12-08 ENCOUNTER — Other Ambulatory Visit: Payer: Self-pay

## 2019-12-08 ENCOUNTER — Encounter (HOSPITAL_COMMUNITY): Payer: Self-pay

## 2019-12-08 ENCOUNTER — Ambulatory Visit (HOSPITAL_COMMUNITY)
Admission: RE | Admit: 2019-12-08 | Discharge: 2019-12-08 | Disposition: A | Discharge status: Home / Self Care | Payer: Medicare Other | Source: Ambulatory Visit | Attending: "Endocrinology | Admitting: "Endocrinology

## 2019-12-08 DIAGNOSIS — E042 Nontoxic multinodular goiter: Secondary | ICD-10-CM | POA: Insufficient documentation

## 2019-12-08 MED ORDER — LIDOCAINE HCL (PF) 2 % IJ SOLN
10.0000 mL | Freq: Once | INTRAMUSCULAR | Status: AC
  Administered 2019-12-08: 10 mL via INTRADERMAL

## 2019-12-08 NOTE — Discharge Instructions (Signed)
Thyroid Needle Biopsy, Care After This sheet gives you information about how to care for yourself after your procedure. Your health care provider may also give you more specific instructions. If you have problems or questions, contact your health care provider. What can I expect after the procedure? After the procedure, it is common to have:  Soreness and tenderness that lasts for a few days.  Bruising where the needle was inserted (puncture site). Follow these instructions at home:   Take over-the-counter and prescription medicines only as told by your health care provider.  To help ease discomfort, keep your head raised (elevated) when you are lying down. When you move from lying down to sitting up, use both hands to support the back of your head and neck.  Check your puncture site every day for signs of infection. Check for: ? Redness, swelling, or pain. ? Fluid or blood. ? Warmth. ? Pus or a bad smell.  Return to your normal activities as told by your health care provider. Ask your health care provider what activities are safe for you.  Keep all follow-up visits as told by your health care provider. This is important. Contact a health care provider if:  You have redness, swelling, or pain around your puncture site.  You have fluid or blood coming from your puncture site.  Your puncture site feels warm to the touch.  You have pus or a bad smell coming from your puncture site.  You have a fever. Get help right away if:  You have severe bleeding from the puncture site.  You have difficulty swallowing.  You have swollen glands (lymph nodes) in your neck. Summary  It is common to have some bruising and soreness where the needle was inserted in your lower front neck area (puncture site).  Check your puncture site every day for signs of infection, such as redness, swelling, or pain.  Get help right away if you have severe bleeding from your puncture site. This  information is not intended to replace advice given to you by your health care provider. Make sure you discuss any questions you have with your health care provider. Document Revised: 01/17/2017 Document Reviewed: 11/18/2016 Elsevier Patient Education  2020 Elsevier Inc.  

## 2019-12-08 NOTE — Sedation Documentation (Signed)
PT tolerated thyroid biopsy procedure well today. Labs obtained and sent for pathology. PT ambulatory at discharge with no acute distress noted, given ice pack and verbalized understanding of discharge instructions. PT's daughter with patient and also verbalized understanding of d/c instructions.

## 2019-12-09 LAB — CYTOLOGY - NON PAP

## 2019-12-15 ENCOUNTER — Other Ambulatory Visit: Payer: Self-pay

## 2019-12-15 ENCOUNTER — Encounter: Payer: Self-pay | Admitting: "Endocrinology

## 2019-12-15 ENCOUNTER — Ambulatory Visit (INDEPENDENT_AMBULATORY_CARE_PROVIDER_SITE_OTHER): Payer: Medicare Other | Admitting: "Endocrinology

## 2019-12-15 VITALS — BP 120/76 | HR 88 | Ht 59.0 in | Wt 140.4 lb

## 2019-12-15 DIAGNOSIS — E042 Nontoxic multinodular goiter: Secondary | ICD-10-CM

## 2019-12-15 NOTE — Progress Notes (Signed)
12/15/2019, 9:02 AM  Endocrinology follow-up note   Subjective:    Patient ID: Lindsay Scott, female    DOB: 1934/06/12, PCP Craig Staggers, MD   Past Medical History:  Diagnosis Date  . Atrial fibrillation (HCC)   . Coronary artery disease   . Hypertension   . Nonrheumatic aortic (valve) stenosis    Past Surgical History:  Procedure Laterality Date  . TRANSCATHETER AORTIC VALVE REPLACEMENT, TRANSFEMORAL     Social History   Socioeconomic History  . Marital status: Widowed    Spouse name: Not on file  . Number of children: Not on file  . Years of education: Not on file  . Highest education level: Not on file  Occupational History  . Not on file  Tobacco Use  . Smoking status: Former Smoker    Quit date: 2003    Years since quitting: 18.8  . Smokeless tobacco: Never Used  Vaping Use  . Vaping Use: Never used  Substance and Sexual Activity  . Alcohol use: Not Currently  . Drug use: Never  . Sexual activity: Not Currently  Other Topics Concern  . Not on file  Social History Narrative  . Not on file   Social Determinants of Health   Financial Resource Strain:   . Difficulty of Paying Living Expenses: Not on file  Food Insecurity:   . Worried About Programme researcher, broadcasting/film/video in the Last Year: Not on file  . Ran Out of Food in the Last Year: Not on file  Transportation Needs:   . Lack of Transportation (Medical): Not on file  . Lack of Transportation (Non-Medical): Not on file  Physical Activity:   . Days of Exercise per Week: Not on file  . Minutes of Exercise per Session: Not on file  Stress:   . Feeling of Stress : Not on file  Social Connections:   . Frequency of Communication with Friends and Family: Not on file  . Frequency of Social Gatherings with Friends and Family: Not on file  . Attends Religious Services: Not on file  . Active Member of Clubs or Organizations: Not on file  . Attends Occupational hygienist Meetings: Not on file  . Marital Status: Not on file   History reviewed. No pertinent family history. Outpatient Encounter Medications as of 12/15/2019  Medication Sig  . apixaban (ELIQUIS) 5 MG TABS tablet Take 5 mg by mouth 2 (two) times daily.  . ferrous sulfate 325 (65 FE) MG EC tablet Take 325 mg by mouth daily.  . folic acid (FOLVITE) 1 MG tablet Take 1 mg by mouth daily.  . furosemide (LASIX) 20 MG tablet Take 1 tablet (20 mg total) by mouth daily as needed.  . meloxicam (MOBIC) 7.5 MG tablet 1 tablet 2 (two) times daily  . memantine (NAMENDA TITRATION PAK) tablet pack 5 mg/day for =1 week; 5 mg twice daily for =1 week; 15 mg/day given in 5 mg and 10 mg separated doses for =1 week; then 10 mg twice daily  . metoprolol tartrate (LOPRESSOR) 100 MG tablet Take 100 mg by mouth 2 (two) times daily.  Marland Kitchen omeprazole (PRILOSEC) 20 MG capsule Take 20 mg by mouth daily before breakfast.  .  potassium chloride (KLOR-CON) 10 MEQ tablet Take 10 mEq by mouth daily.  . Vitamin D, Ergocalciferol, (DRISDOL) 1.25 MG (50000 UNIT) CAPS capsule Take 50,000 Units by mouth every Sunday.   No facility-administered encounter medications on file as of 12/15/2019.   ALLERGIES: Allergies  Allergen Reactions  . Betadine [Povidone Iodine] Other (See Comments)  . Latex Other (See Comments)    unkn  . Penicillins Swelling  . Sulfa Antibiotics Other (See Comments)    unkn    VACCINATION STATUS: Immunization History  Administered Date(s) Administered  . Moderna SARS-COVID-2 Vaccination 03/22/2019, 04/19/2019    HPI Krisha Beegle is 85 y.o. female who presents today to discuss her fine-needle aspiration biopsy results.  she is accompanied by her son.  History is obtained mainly from chart review.  She was seen in consultation for nodular goiter requested by  Craig Staggers, MD.  She is known to have nodular goiter based on the CT scan finding in September 2021.  She was subsequently  sent for thyroid ultrasound which was completed on November 01, 2019 and revealed 4.6 cm right lobe with 2 nodules measuring 0.7 cm in 2.6 cm.  Left lobe was 3.2 cm.  Patient is a poor historian.  She does not have compressive symptoms.   Her subsequent biopsy reveals benign findings. There is no report of palpitations, tremors, nor heat/cold intolerance.  Her thyroid function test on 2 separate occasions in September were within normal limits.   -No recent weight change was appreciated/reported by the patient.  She has medical history of hypertension, coronary artery disease, atrial fibrillation.  She is on Eliquis.  Review of Systems  Limited as above.  Objective:    Vitals with BMI 12/15/2019 12/08/2019 12/08/2019  Height 4\' 11"  - -  Weight 140 lbs 6 oz - 140 lbs  BMI 28.34 - 28.26  Systolic 120 152  Diastolic 76 80 64  Pulse 88 90 96    BP 120/76   Pulse 88   Ht 4\' 11"  (1.499 m)   Wt 140 lb 6.4 oz (63.7 kg)   BMI 28.36 kg/m   Wt Readings from Last 3 Encounters:  12/15/19 140 lb 6.4 oz (63.7 kg)  12/08/19 140 lb (63.5 kg)  11/30/19 141 lb 6.4 oz (64.1 kg)    Physical Exam   Physical Exam- Limited  Constitutional:  Body mass index is 28.36 kg/m. , not in acute distress, normal state of mind Eyes:  EOMI, no exophthalmos Neck: Supple Thyroid: - gross goiter Respiratory: Adequate breathing efforts Musculoskeletal: no gross deformities, strength intact in all four extremities, no gross restriction of joint movements Skin:  no rashes, no hyperemia Neurological: no tremor with outstretched hands   CMP ( most recent) CMP     Component Value Date/Time   NA 140 11/01/2019 0705   K 4.1 11/01/2019 0705   CL 105 11/01/2019 0705   CO2 26 11/01/2019 0705   GLUCOSE 91 11/01/2019 0705   BUN 7 (L) 11/01/2019 0705   CREATININE 0.68 11/01/2019 0705   CALCIUM 9.9 11/01/2019 0705   PROT 6.4 (L) 10/31/2019 0755   ALBUMIN 3.2 (L) 10/31/2019 0755   AST 18 10/31/2019 0755    ALT 13 10/31/2019 0755   ALKPHOS 87 10/31/2019 0755   BILITOT 1.4 (H) 10/31/2019 0755   GFRNONAA >60 11/01/2019 0705   GFRAA >60 11/01/2019 0705     Diabetic Labs (most recent): Lab Results  Component Value Date   HGBA1C 5.5 10/31/2019  Lipid Panel ( most recent) Lipid Panel     Component Value Date/Time   CHOL 142 10/31/2019 0755   TRIG 97 10/31/2019 0755   HDL 29 (L) 10/31/2019 0755   CHOLHDL 4.9 10/31/2019 0755   VLDL 19 10/31/2019 0755   LDLCALC 94 10/31/2019 0755      Lab Results  Component Value Date   TSH 2.240 11/09/2019   TSH 0.730 10/31/2019    Thyroid ultrasound from November 01, 2019: Right lobe 4.6 x 2.3 x 1.8 cm, with 2 nodules measuring 0.7 cm and 2.6 cm.  Left lobe measuring 3.2 x 1.6 x 0.7 cm.  No discrete nodules.  Fine-needle aspiration biopsy on December 08, 2019  Clinical History: 2.6 cm RLL  Specimen Submitted: A. THYROID, RIGHT, FINE NEEDLE ASPIRATION:    FINAL MICROSCOPIC DIAGNOSIS:  - Consistent with benign follicular nodule (Bethesda category II)   SPECIMEN ADEQUACY:  Satisfactory for evaluation   Assessment & Plan:   1. Multinodular goiter  Her biopsy results are negative for malignancy.  She will not need any antithyroid intervention with surgery or medications.  Her recent thyroid function tests are consistent with euthyroid state.   - I did not initiate any new prescriptions today. -She will need repeat thyroid function test in 1 year. - she is advised to maintain close follow up with Craig Staggers, MD for primary care needs.      - Time spent on this patient care encounter:  20 minutes of which 50% was spent in  counseling and the rest reviewing  her current and  previous labs / studies and medications  doses and developing a plan for long term care. Lindsay Scott  participated in the discussions, expressed understanding, and voiced agreement with the above plans.  All questions were answered to her  satisfaction. she is encouraged to contact clinic should she have any questions or concerns prior to her return visit.   Follow up plan: Return in about 1 year (around 12/14/2020) for F/U with Pre-visit Labs.   Marquis Lunch, MD [facility] [address] [place], [place] [address] Phone: [phone]  Fax: [phone]     12/15/2019, 9:02 AM  This note was partially dictated with voice recognition software. Similar sounding words can be transcribed inadequately or may not  be corrected upon review.

## 2019-12-28 ENCOUNTER — Telehealth: Payer: Self-pay | Admitting: Neurology

## 2019-12-28 NOTE — Telephone Encounter (Signed)
Daughter([place]ew[place], [name] on DPR) would like to move forward with pt going on Aricept, daughter states pt's hallucinations are worsening.  Please call

## 2019-12-29 ENCOUNTER — Other Ambulatory Visit: Payer: Self-pay | Admitting: Neurology

## 2019-12-29 MED ORDER — DONEPEZIL HCL 10 MG PO TABS: 10.0000 mg | ORAL_TABLET | Freq: Every day | ORAL | 2 refills | Status: DC

## 2019-12-29 NOTE — Telephone Encounter (Signed)
Okay I will prescribe Aricept 5 mg daily for 1 month and to be increased to 10 mg if tolerated

## 2019-12-29 NOTE — Telephone Encounter (Signed)
Spoke to daughter and she has weaned her mom as recommended the [place].  She is no longer taking Namenda. Stated her mom is having visual hallucinations at night.  Would like to go ahead and try the Aricept the Dr. Pearlean Brownie mentioned at the last visit.  Also asking if she can have more inclusive literature for her mom that explains Dementia at their next appointment, 11/30.  She said her mom doesn't understand how she can be diagnosed with dementia in only 15 minutes.

## 2019-12-30 ENCOUNTER — Other Ambulatory Visit: Payer: Self-pay | Admitting: Emergency Medicine

## 2019-12-30 MED ORDER — DONEPEZIL HCL 10 MG PO TABS: 10.0000 mg | ORAL_TABLET | Freq: Every day | ORAL | 2 refills | Status: DC

## 2019-12-30 NOTE — Telephone Encounter (Signed)
Called and reached daughter, informed her Dr. Pearlean Brownie prescribed the Aricept and went over the directions.  Daughter had no further questions and expressed appreciation.

## 2020-01-18 ENCOUNTER — Ambulatory Visit (INDEPENDENT_AMBULATORY_CARE_PROVIDER_SITE_OTHER): Payer: Medicare Other | Admitting: Neurology

## 2020-01-18 ENCOUNTER — Encounter: Payer: Self-pay | Admitting: Neurology

## 2020-01-18 VITALS — BP 129/65 | HR 77 | Ht 59.0 in | Wt 120.4 lb

## 2020-01-18 DIAGNOSIS — F0280 Dementia in other diseases classified elsewhere without behavioral disturbance: Secondary | ICD-10-CM

## 2020-01-18 DIAGNOSIS — G309 Alzheimer's disease, unspecified: Secondary | ICD-10-CM

## 2020-01-18 NOTE — Patient Instructions (Addendum)
I had a long discussion with the patient and her daughter regarding her dementia and slight response to Aricept.  I recommend she continue Aricept and increase after a month to 10 mg if tolerated.  She will return for follow-up in the future in 3 months with my nurse practitioner Shanda Bumps in 3 months or call earlier if necessary.  Alzheimer Disease Caregiver Guide  Alzheimer disease causes a person to lose the ability to remember things and make decisions. A person who has Alzheimer disease may not be able to take care of himself or herself. He or she may need help with simple tasks. The tips below can help you care for the person. What kind of changes does this condition cause? This condition makes a person:  Forget things.  Feel confused.  Act differently.  Have different moods. These things get worse with time. Tips to help with symptoms  Be calm and patient.  Respond with a simple, short answer.  Avoid correcting the person in a negative way.  Try not to take things personally, even if the person forgets your name.  Do not argue with the person. This may make the person more upset. Tips to lessen frustration  Make appointments and do daily tasks when the person is at his or her best.  Take your time. Simple tasks may take longer. Allow plenty of time to complete tasks.  Limit choices for the person.  Involve the person in what you are doing.  Keep a daily routine.  Avoid new or crowded places, if possible.  Use simple words, short sentences, and a calm voice. Only give one direction at a time.  Buy clothes and shoes that are easy to put on and take off.  Organize medicines in a pillbox for each day of the week.  Keep a calendar in a central location to remind the person of meetings or other activities.  Let people help if they offer. Take a break when needed. Tips to prevent injury  Keep floors clear. Remove rugs, magazine racks, and floor lamps.  Keep  hallways well-lit.  Put a handrail and non-slip mat in the bathtub or shower.  Put childproof locks on cabinets that have dangerous items in them. These items include medicine, alcohol, guns, toxic cleaning items, sharp tools, matches, and lighters.  Put locks on doors where the person cannot see or reach them. This helps the person to not wander out of the house and get lost.  Be prepared for emergencies. Keep a list of emergency phone numbers and addresses close by.  Bracelets may be worn that track location and identify the person as having memory problems. This should be worn at all times for safety. Tips for the future  Discuss financial and legal planning early. People with this disease have trouble managing their money as the disease gets worse. Get help from a professional.  Talk about advance directives, safety, and daily care. Take these steps: ? Create a living will and choose a power of attorney. This is someone who can make decisions for the person with Alzheimer disease when he or she can no longer do so. ? Discuss driving safety and when to stop driving. The person's doctor can help with this. ? If the person lives alone, make sure he or she is safe. Some people need extra help at home. Other people need more care at a nursing home or care center. Where to find support You can find support by joining a support  group near you. Some benefits of joining a support group include:  Learning ways to manage stress.  Sharing experiences with others.  Getting emotional comfort and support.  Learning about caregiving as the disease progresses.  Knowing what community resources are available and making use of them. Where to find more information  Alzheimer's Association: [employer] Contact a doctor if:  The person has a fever.  The person has a sudden behavior change that does not get better with calming strategies.  The person is not able to take care of himself or  herself at home.  The person threatens you or anyone else, including himself or herself.  You are no longer able to care for the person. Summary  Alzheimer disease causes a person to forget things and to be confused.  A person who has this condition may not be able to take care of himself or herself.  Take steps to keep the person from getting hurt. Plan for future care.  You can find support by joining a support group near you. This information is not intended to replace advice given to you by your health care provider. Make sure you discuss any questions you have with your health care provider. Document Revised: 05/26/2018 Document Reviewed: 01/30/2017 Elsevier Patient Education  2020 [employer].

## 2020-01-18 NOTE — Progress Notes (Signed)
[name] [address] [place].  [address] [phone]       OFFICE FOLLOW UP VISIT NOTE  [name] Date of Birth:  01/09/1935 Medical Record Number:  [id number]   Referring MD:  Lorin Glass  Reason for Referral: Aphasia  HPI: Initial visit 11/09/2019 [name] is a pleasant 85 year old Caucasian lady seen today for initial office consultation visit for aphasia.  She is accompanied by her daughter.  History is obtained from them, review of electronic medical records and I personally reviewed pertinent available imaging films in PACS.  She has past medical history of nonrheumatic aortic stenosis, s/p TAVR, paroxysmal A. fib on chronic anticoagulation with Eliquis, hypertension, coronary artery disease.  She presented to [facility] on 10/30/2019 with a 10-day history of worsening speech and difficulty articulating and dizziness.  Lab work was significant only for low potassium of 2.9 and CT scan of the head showed no acute abnormality and CT angiogram showed no significant large vessel intracranial extracranial stenosis.  MRI scan of the brain was also obtained which showed no acute abnormalities.  LDL cholesterol 94 mg percent hemoglobin A1c was 5.5.  B12 and TSH were normal.  Echo was not done.  Patient had been started on Lexapro which was discontinued in the hospital and her speech difficulties improved.  However the daughter feels that she has noticed significant cognitive decline and memory difficulties since then.  On inquiry she states feels that this has been going on but has clearly worsened in the last 1 month.  Patient requires information to be given multiple times since she still forgets.  She is required more help with activities of daily living recently.  She does also occasionally get dizzy and feels avoid a straight into the right.  She is also seeing shadows in her room particularly at nighttime.  On Mini-Mental status exam today she  scored 20/30 in the office and then significant problems with visual-spatial skills, animal naming and drawing a clock.  She is not had any work-up for reversible causes of memory loss or treatment for Alzheimer's.  There is no family stable tremors.  Patient has no prior history of known stroke, TIA, seizures, significant head injury with loss of consciousness.  She has not exhibited any unsafe behavior.  She denies any delusions.  She is mostly calm and well behaved but does get irritable and agitated at times but there is no violent behavior.  She is not demonstrated tendency to wander off.  She does not drive. Update 01/18/2020: She returns for follow-up after last visit 2 months ago.  She is accompanied by her daughter.  Patient had trouble tolerating Namenda and developed increased hallucinations and felt like she was drunk.  She states she also broke out into sores.  She called the office and we advised her to taper and stop it.  She is started feeling better immediately after that.  Patient was then started on Aricept and she is currently taking 5 mg daily since 12/31/2019 and she states states she is already feeling better.  Her bad dreams are gone and hallucinations have also improved.  She is tolerating it well without upset stomach nausea or diarrhea.  She is however been progressively losing weight for the last 6 months or so.  She however denies lack of energy or tiredness.  She underwent EEG on 12/06/2019 which showed mild generalized slowing without epileptiform activity.  Lab work on 11/09/2019 showed normal vitamin B12, TSH  and RPR.  MRI scan of the brain on 11/01/2019 showed no acute abnormalities.  Patient has no new complaints. ROS:   14 system review of systems is positive for dizziness, hallucinations speech difficulties, memory loss, cognitive decline, agitation, irritability and all other systems negative PMH:  Past Medical History:  Diagnosis Date  . Atrial fibrillation (HCC)   .  Coronary artery disease   . Hypertension   . Nonrheumatic aortic (valve) stenosis     Social History:  Social History   Socioeconomic History  . Marital status: Widowed    Spouse name: Not on file  . Number of children: Not on file  . Years of education: Not on file  . Highest education level: Not on file  Occupational History  . Not on file  Tobacco Use  . Smoking status: Former Smoker    Quit date: 2003    Years since quitting: 18.9  . Smokeless tobacco: Never Used  Vaping Use  . Vaping Use: Never used  Substance and Sexual Activity  . Alcohol use: Not Currently  . Drug use: Never  . Sexual activity: Not Currently  Other Topics Concern  . Not on file  Social History Narrative   Lives with daughter Lindsay Scott   Right Handed   Drinks no caffeine   Social Determinants of Health   Financial Resource Strain:   . Difficulty of Paying Living Expenses: Not on file  Food Insecurity:   . Worried About Programme researcher, broadcasting/film/video in the Last Year: Not on file  . Ran Out of Food in the Last Year: Not on file  Transportation Needs:   . Lack of Transportation (Medical): Not on file  . Lack of Transportation (Non-Medical): Not on file  Physical Activity:   . Days of Exercise per Week: Not on file  . Minutes of Exercise per Session: Not on file  Stress:   . Feeling of Stress : Not on file  Social Connections:   . Frequency of Communication with Friends and Family: Not on file  . Frequency of Social Gatherings with Friends and Family: Not on file  . Attends Religious Services: Not on file  . Active Member of Clubs or Organizations: Not on file  . Attends Banker Meetings: Not on file  . Marital Status: Not on file  Intimate Partner Violence:   . Fear of Current or Ex-Partner: Not on file  . Emotionally Abused: Not on file  . Physically Abused: Not on file  . Sexually Abused: Not on file    Medications:   Current Outpatient Medications on File Prior to Visit   Medication Sig Dispense Refill  . apixaban (ELIQUIS) 5 MG TABS tablet Take 5 mg by mouth 2 (two) times daily.    Marland Kitchen donepezil (ARICEPT) 10 MG tablet Take 1 tablet (10 mg total) by mouth at bedtime. Start 1/2 tablet daily x 4 weeks and then 1 tablet daily 30 tablet 2  . ferrous sulfate 325 (65 FE) MG EC tablet Take 325 mg by mouth every other day.     . folic acid (FOLVITE) 1 MG tablet Take 1 mg by mouth daily.    . furosemide (LASIX) 20 MG tablet Take 1 tablet (20 mg total) by mouth daily as needed. (Patient taking differently: Take 40 mg by mouth daily as needed. ) 30 tablet   . meloxicam (MOBIC) 7.5 MG tablet as needed.     . metoprolol tartrate (LOPRESSOR) 100 MG tablet Take 100 mg by  mouth 2 (two) times daily.    Marland Kitchen omeprazole (PRILOSEC) 20 MG capsule Take 20 mg by mouth daily before breakfast.    . potassium chloride (KLOR-CON) 10 MEQ tablet Take 10 mEq by mouth daily.    . Vitamin D, Ergocalciferol, (DRISDOL) 1.25 MG (50000 UNIT) CAPS capsule Take 50,000 Units by mouth every Sunday.     No current facility-administered medications on file prior to visit.    Allergies:   Allergies  Allergen Reactions  . Betadine [Povidone Iodine] Other (See Comments)  . Latex Other (See Comments)    unkn  . Penicillins Swelling  . Sulfa Antibiotics Other (See Comments)    unkn    Physical Exam General: Frail elderly Caucasian lady seated, in no evident distress Head: head normocephalic and atraumatic.   Neck: supple with no carotid or supraclavicular bruits Cardiovascular: regular rate and rhythm, no murmurs Musculoskeletal: no deformity Skin:  no rash/petichiae Vascular:  Normal pulses all extremities  Neurologic Exam Mental Status: Awake and fully alert. Oriented to place and time. Recent and remote memory poor. Attention span, concentration and fund of knowledge diminished. Mood and affect appropriate.  Mini-Mental status exam not done today.  She was unable to copy intersecting pentagons.   Clock drawing was only 1/4.  Recall was diminished at 2/3.  She was unable to name any animals which walk on 4 legs or draw clock.  Speech is hesitant with at times word finding difficulty.  Good comprehension, naming and repetition. Marland Kitchen .Cranial Nerves: Fundoscopic exam reveals sharp disc margins. Pupils equal, briskly reactive to light. Extraocular movements full without nystagmus. Visual fields full to confrontation. Hearing diminished bilaterally.  Facial sensation intact. Face, tongue, palate moves normally and symmetrically.  Motor: Normal bulk and tone. Normal strength in all tested extremity muscles.  Mild action tremor of outstretched upper extremities.  No cogwheel rigidity.  No bradykinesia. Sensory.: intact to touch , pinprick , position and vibratory sensation.  Coordination: Rapid alternating movements normal in all extremities. Finger-to-nose and heel-to-shin performed accurately bilaterally. Gait and Station: Arises from chair without difficulty. Stance is slightly stooped. Gait demonstrates normal stride length and balance . Able to heel, toe and tandem walk with moderate difficulty.  Reflexes: 1+ and symmetric. Toes downgoing.      ASSESSMENT: 85 year old Caucasian lady with progressive memory and cognitive and speech issues for the last month likely from underlying mild Alzheimer's dementia and with speech difficulties that had transiently worsened after medication effect of Lexapro.     PLAN: I had a long discussion with the patient and her daughter regarding her dementia and slight response to Aricept.  I recommend she continue Aricept and increase after a month to 10 mg if tolerated.  She will return for follow-up in the future in 3 months with my nurse practitioner Shanda Bumps in 3 months or call earlier if necessary.  Greater than 50% time during this 25-minute consultation was it was spent on counseling and coordination of care about her speech and memory and cognitive  difficulties and diagnosis of Alzheimer's and answering questions   Delia Heady, MD  [facility] [address] [place], [place] [address]  Phone [phone] Fax [phone] Note: This document was prepared with digital dictation and possible smart phrase technology. Any transcriptional errors that result from this process are unintentional.

## 2020-02-29 ENCOUNTER — Telehealth: Payer: Self-pay | Admitting: Neurology

## 2020-02-29 NOTE — Telephone Encounter (Signed)
Pt's daughter, Tylene Fantasia (on [place]) called, Saturday evening not able to sleep and having anxiety. Pt wanted to call the preacher because she thought she was dying. Gave her some melatonin last night, slept a little bit. Could you prescribe something to help with her anxiety and not being able to sleep. Would like a call from the nurse.

## 2020-02-29 NOTE — Telephone Encounter (Signed)
I would have the daughter call the PCP back since he is seems to be managing   these issues and address her concern about Lexapro being substituted with alternative medication

## 2020-02-29 NOTE — Telephone Encounter (Signed)
PCP recommended for patient to take 2 melatonin gummies last night.  It did seem to help, daughter reported that patient slept much better.  She stated that her mom is c/o of nervousness and wanting to jump out of her skin.   PCP also suggested she start taking her lexapro again. She started taking 1/2 tablet of Lexapro 10 mg and it seems to make her worse.  Daughter is asking for another medication that will help her mom and let her relax and sleep.  She had to call her mom's pastor because she kept saying she was going to die.

## 2020-03-01 ENCOUNTER — Other Ambulatory Visit: Payer: Self-pay | Admitting: Neurology

## 2020-03-01 ENCOUNTER — Telehealth: Payer: Self-pay | Admitting: Neurology

## 2020-03-01 MED ORDER — TRAZODONE HCL 50 MG PO TABS: 50.0000 mg | ORAL_TABLET | Freq: Every day | ORAL | 0 refills | Status: DC

## 2020-03-01 NOTE — Telephone Encounter (Signed)
Called Lindsay Scott with Dr. Marlis Edelson recommendation.  Lindsay Scott stated that lexapro makes her mom "bat shit crazy".  Encouraged her to called patient's PCP regarding possible change in medications.  Daughter verbalized understanding and stated she has contacted the PCP for UTI testing  Patient expressed appreciation.

## 2020-03-01 NOTE — Telephone Encounter (Signed)
I spoke to her daughter who informed me that she is not sleeping at night and a result is upset , anxious and having more panic attacks.  I recommend we try trazodone 50 mg at night to help her sleep and hopefully this will reduce her anxiety and panic symptoms as well

## 2020-03-01 NOTE — Telephone Encounter (Signed)
Patient's daughter, [name], left a message with the call center this morning at 7:23 AM: Patient has panic, anxiety, sleeplessness and dementia, needs prescription to calm down.  Please call.

## 2020-03-01 NOTE — Telephone Encounter (Signed)
Please see phone note  

## 2020-03-01 NOTE — Telephone Encounter (Signed)
I called the patient's daughter and left a message on her answering machine to call me back.

## 2020-03-26 ENCOUNTER — Other Ambulatory Visit: Payer: Self-pay | Admitting: Neurology

## 2020-03-28 ENCOUNTER — Other Ambulatory Visit: Payer: Self-pay | Admitting: Neurology

## 2020-04-26 ENCOUNTER — Other Ambulatory Visit: Payer: Self-pay | Admitting: Neurology

## 2020-04-27 NOTE — Telephone Encounter (Signed)
Received rx request, called and spoke to daughter, stated patient is sleeping and doing better since the prescription.  Will refill.

## 2020-05-11 ENCOUNTER — Ambulatory Visit (INDEPENDENT_AMBULATORY_CARE_PROVIDER_SITE_OTHER): Payer: Medicare Other | Admitting: Adult Health

## 2020-05-11 ENCOUNTER — Encounter: Payer: Self-pay | Admitting: Adult Health

## 2020-05-11 VITALS — BP 140/70 | HR 75 | Ht <= 58 in | Wt 110.0 lb

## 2020-05-11 DIAGNOSIS — G309 Alzheimer's disease, unspecified: Secondary | ICD-10-CM

## 2020-05-11 DIAGNOSIS — F0280 Dementia in other diseases classified elsewhere without behavioral disturbance: Secondary | ICD-10-CM

## 2020-05-11 MED ORDER — TRAZODONE HCL 50 MG PO TABS
75.0000 mg | ORAL_TABLET | Freq: Every day | ORAL | 5 refills | Status: DC
Start: 2020-05-11 — End: 2020-11-14

## 2020-05-11 NOTE — Patient Instructions (Signed)
Your Plan:  Use of trazadone 50 - 75mg  nightly (1.5 tabs) for anxiety and sleep  Continue Aricept 10mg  nightly    Follow up in 6 months or call earlier if needed      Thank you for coming to see at [facility]. I hope we have been able to provide you high quality care today.  You may receive a patient satisfaction survey over the next few weeks. We would appreciate your feedback and comments so that we may continue to improve ourselves and the health of our patients.

## 2020-05-11 NOTE — Progress Notes (Signed)
I agree with the above plan 

## 2020-05-11 NOTE — Progress Notes (Signed)
[name] [address] [place]. [place] [address] [phone]       OFFICE FOLLOW UP VISIT NOTE  [name] Date of Birth:  Jun 24, 1934 Medical Record Number:  [id number]   Referring MD:  Lorin Glass  Reason for visit: Alzheimer's dementia   Chief Complaint  Patient presents with  . Follow-up    TR with daughter (charmin) Pt is well, memory is stable per her and daughter      HPI:  Today, 05/11/2020, Lindsay Scott returns for follow-up after prior visit approximately 4 months ago.  She is accompanied by her daughter [name]  Per daughter, she has been doing very well since prior visit and believes she has even been improving especially since starting Aricept.  She is independent with ADLs, ambulates without assistive device and denies any recent falls.  She does have underlying anxiety and insomnia but no other behavioral concerns.  On trazodone 50 mg nightly with some improvement of her sleep -currently sleeping approximately 4 hours at a time where previously would only sleep for 1 to 2 hours.  Trazodone has also helped with anxiety. MMSE today 24/30 (prior 20/30)  Other concerns: Continued weight loss approximately 10 pounds since prior visit as she was limited in what she can eat as she was unable to use her dentures.  She is currently working with dentist to get new dentures and is hopeful to have these within the next 1 to 2 weeks.  Since prior visit, she was seen at [place], [place] for bleeding colonic polyps - removed 2 during admission - since then, no further bleeding issues  Seen rheumatology Monday for further evaluation of arthritis.  She had limited use of her hands with increased swelling and severe pain limiting ADLs and daily functioning. Was started on meloxicam and prednisone for total of 5 days - daughter is not sure if they are supposed to discontinue after that time or if they were supposed to follow-up.  Apparently, patient made a  comment regarding use of meloxicam and being on Eliquis but per patient and daughter, he did not express any concerns.  She has not noticed any issues with bleeding or increased bruising    MMSE - Mini Mental State Exam 05/11/2020 11/09/2019  Orientation to time 5 3  Orientation to Place 5 4  Registration 3 3  Attention/ Calculation 1 0  Recall 2 3  Language- name 2 objects 2 2  Language- repeat 1 0  Language- follow 3 step command 3 2  Language- read & follow direction 1 1  Write a sentence 1 1  Copy design 0 1  Total score 24 20      History provided for reference purposes only Update 01/18/2020 JM: She returns for follow-up after last visit 2 months ago.  She is accompanied by her daughter.  Patient had trouble tolerating Namenda and developed increased hallucinations and felt like she was drunk.  She states she also broke out into sores.  She called the office and we advised her to taper and stop it.  She is started feeling better immediately after that.  Patient was then started on Aricept and she is currently taking 5 mg daily since 12/31/2019 and she states states she is already feeling better.  Her bad dreams are gone and hallucinations have also improved.  She is tolerating it well without upset stomach nausea or diarrhea.  She is however been progressively losing weight for the last 6 months or so.  She however denies lack of energy or tiredness.  She underwent EEG on 12/06/2019 which showed mild generalized slowing without epileptiform activity.  Lab work on 11/09/2019 showed normal vitamin B12, TSH and RPR.  MRI scan of the brain on 11/01/2019 showed no acute abnormalities.  Patient has no new complaints.   Initial visit 11/09/2019 Dr. Pearlean Brownie: Lindsay Scott is a pleasant 86 year old Caucasian lady seen today for initial office consultation visit for aphasia.  She is accompanied by her daughter.  History is obtained from them, review of electronic medical records and I personally reviewed  pertinent available imaging films in PACS.  She has past medical history of nonrheumatic aortic stenosis, s/p TAVR, paroxysmal A. fib on chronic anticoagulation with Eliquis, hypertension, coronary artery disease.  She presented to [facility] on 10/30/2019 with a 10-day history of worsening speech and difficulty articulating and dizziness.  Lab work was significant only for low potassium of 2.9 and CT scan of the head showed no acute abnormality and CT angiogram showed no significant large vessel intracranial extracranial stenosis.  MRI scan of the brain was also obtained which showed no acute abnormalities.  LDL cholesterol 94 mg percent hemoglobin A1c was 5.5.  B12 and TSH were normal.  Echo was not done.  Patient had been started on Lexapro which was discontinued in the hospital and her speech difficulties improved.  However the daughter feels that she has noticed significant cognitive decline and memory difficulties since then.  On inquiry she states feels that this has been going on but has clearly worsened in the last 1 month.  Patient requires information to be given multiple times since she still forgets.  She is required more help with activities of daily living recently.  She does also occasionally get dizzy and feels avoid a straight into the right.  She is also seeing shadows in her room particularly at nighttime.  On Mini-Mental status exam today she scored 20/30 in the office and then significant problems with visual-spatial skills, animal naming and drawing a clock.  She is not had any work-up for reversible causes of memory loss or treatment for Alzheimer's.  There is no family stable tremors.  Patient has no prior history of known stroke, TIA, seizures, significant head injury with loss of consciousness.  She has not exhibited any unsafe behavior.  She denies any delusions.  She is mostly calm and well behaved but does get irritable and agitated at times but there is no violent behavior.   She is not demonstrated tendency to wander off.  She does not drive.  ROS:   14 system review of systems is positive for those listed in HPI and all other systems negative    PMH:  Past Medical History:  Diagnosis Date  . Atrial fibrillation (HCC)   . Coronary artery disease   . Hypertension   . Nonrheumatic aortic (valve) stenosis     Social History:  Social History   Socioeconomic History  . Marital status: Widowed    Spouse name: Not on file  . Number of children: Not on file  . Years of education: Not on file  . Highest education level: Not on file  Occupational History  . Not on file  Tobacco Use  . Smoking status: Former Smoker    Quit date: 2003    Years since quitting: 19.2  . Smokeless tobacco: Never Used  Vaping Use  . Vaping Use: Never used  Substance and Sexual Activity  . Alcohol use:  Not Currently  . Drug use: Never  . Sexual activity: Not Currently  Other Topics Concern  . Not on file  Social History Narrative   Lives with daughter [name]   Right Handed   Drinks no caffeine   Social Determinants of Health   Financial Resource Strain: Not on file  Food Insecurity: Not on file  Transportation Needs: Not on file  Physical Activity: Not on file  Stress: Not on file  Social Connections: Not on file  Intimate Partner Violence: Not on file    Medications:   Current Outpatient Medications on File Prior to Visit  Medication Sig Dispense Refill  . apixaban (ELIQUIS) 5 MG TABS tablet Take 5 mg by mouth daily.    Marland Kitchen. donepezil (ARICEPT) 10 MG tablet Take 1 tablet (10 mg total) by mouth at bedtime. 90 tablet 2  . ferrous sulfate 325 (65 FE) MG EC tablet Take 325 mg by mouth every other day.     . folic acid (FOLVITE) 1 MG tablet Take 1 mg by mouth daily.    . furosemide (LASIX) 20 MG tablet Take 1 tablet (20 mg total) by mouth daily as needed. (Patient taking differently: Take 40 mg by mouth daily as needed.) 30 tablet   . meloxicam (MOBIC) 7.5 MG  tablet as needed.     . metoprolol tartrate (LOPRESSOR) 50 MG tablet Take 50 mg by mouth 2 (two) times daily.    Marland Kitchen. omeprazole (PRILOSEC) 20 MG capsule Take 20 mg by mouth daily before breakfast.    . Potassium Chloride ER 20 MEQ TBCR Take 10 mEq by mouth daily.    . traZODone (DESYREL) 50 MG tablet TAKE 1 TABLET(50 MG) BY MOUTH AT BEDTIME 30 tablet 5  . Vitamin D, Ergocalciferol, (DRISDOL) 1.25 MG (50000 UNIT) CAPS capsule Take 50,000 Units by mouth every Sunday.     No current facility-administered medications on file prior to visit.    Allergies:   Allergies  Allergen Reactions  . Betadine [Povidone Iodine] Other (See Comments)  . Latex Other (See Comments)    unkn  . Penicillins Swelling  . Sulfa Antibiotics Other (See Comments)    unkn    Physical Exam Today's Vitals   05/11/20 1534  BP: 140/70  Pulse: 75  Weight: 110 lb (49.9 kg)  Height: 4\' 10"  (1.473 m)   Body mass index is 22.99 kg/m.  General: Frail very pleasant elderly Caucasian lady seated, in no evident distress Head: head normocephalic and atraumatic.   Neck: supple with no carotid or supraclavicular bruits Cardiovascular: regular rate and rhythm, no murmurs Musculoskeletal: Multiple bilateral hand arthritic nodules Skin:  no rash/petichiae Vascular:  Normal pulses all extremities  Neurologic Exam Mental Status: Awake and fully alert. Oriented to place and time. Recent and remote memory poor. Attention span, concentration and fund of knowledge diminished. Mood and affect appropriate. Speech is hesitant with at times word finding difficulty.  Good comprehension, naming and repetition. MMSE 24/30 (see HPI) (prior 20/30)  Cranial Nerves: Pupils equal, briskly reactive to light. Extraocular movements full without nystagmus. Visual fields full to confrontation. Hearing diminished bilaterally.  Facial sensation intact. Face, tongue, palate moves normally and symmetrically.  Motor: Normal bulk and tone. Normal  strength in all tested extremity muscles.  Mild action tremor of outstretched upper extremities.  No evidence of tremors at rest.  No cogwheel rigidity.  No bradykinesia. Sensory.: intact to touch , pinprick , position and vibratory sensation.  Coordination: Rapid alternating movements normal in  all extremities. Finger-to-nose and heel-to-shin performed accurately bilaterally. Gait and Station: Arises from chair without difficulty. Stance is slightly stooped. Gait demonstrates normal stride length and balance without use of assistive device. Able to heel, toe and tandem walk with moderate difficulty.  Reflexes: 1+ and symmetric. Toes downgoing.      ASSESSMENT/PLAN: Lindsay Scott is a very pleasant 86 year old Caucasian lady with progressive memory, cognitive and speech issues evaluated by Dr. Pearlean Brownie 11/09/2019 likely from underlying mild Alzheimer's dementia and with speech difficulties that had transiently worsened after medication effect of Lexapro.    Alzheimer's dementia -Doing very well with daughter reporting improvement of cognition overall -MMSE today 24/30 (prior 20/30) -Continue Aricept 10 mg nightly -Increase trazodone from 50 mg to 75 mg nightly (increased dosage due to continued limited sleep although improved since starting and continued anxiety although also improved since starting).  Daughter is aware of possible side effects office with any concerns -Dementia panel WNL -MR brain 10/2019 frontal lobe cerebral atrophy with no acute abnormalities -EEG mild slowing but no seizure activity noted -Intolerant to [employer] - reported visual hallucinations, "drunk" and rash    Follow-up in 6 months or call earlier if needed    CC:  [facility] provider: Craig Staggers, MD   I spent 30 minutes of face-to-face and non-face-to-face time with patient and daughter.  This included previsit chart review, lab review, study review, order entry, electronic health record documentation, patient  education and discussion regarding Alzheimer's dementia with completion and review of MMSE, use of trazodone for insomnia and anxiety and possible side effects and answered all other questions to patient and daughter satisfaction  Ihor Austin, AGNP-BC  [facility] [address] [place], [place] [address]  Phone [phone] Fax [phone] Note: This document was prepared with digital dictation and possible smart phrase technology. Any transcriptional errors that result from this process are unintentional.

## 2020-09-23 ENCOUNTER — Other Ambulatory Visit: Payer: Self-pay | Admitting: Neurology

## 2020-11-14 ENCOUNTER — Encounter: Payer: Self-pay | Admitting: Adult Health

## 2020-11-14 ENCOUNTER — Ambulatory Visit (INDEPENDENT_AMBULATORY_CARE_PROVIDER_SITE_OTHER): Payer: Medicare Other | Admitting: Adult Health

## 2020-11-14 VITALS — BP 155/80 | HR 61 | Ht 59.0 in | Wt 108.0 lb

## 2020-11-14 DIAGNOSIS — G309 Alzheimer's disease, unspecified: Secondary | ICD-10-CM

## 2020-11-14 DIAGNOSIS — F0280 Dementia in other diseases classified elsewhere without behavioral disturbance: Secondary | ICD-10-CM | POA: Diagnosis not present

## 2020-11-14 MED ORDER — DONEPEZIL HCL 10 MG PO TABS: 10.0000 mg | ORAL_TABLET | Freq: Every day | ORAL | 2 refills | Status: DC

## 2020-11-14 MED ORDER — TRAZODONE HCL 100 MG PO TABS: 100.0000 mg | ORAL_TABLET | Freq: Every day | ORAL | 3 refills | Status: DC

## 2020-11-14 NOTE — Progress Notes (Signed)
[name] [address] [place]. [place] [address] [phone]       OFFICE FOLLOW UP VISIT NOTE  Lindsay Scott Date of Birth:  08-05-1934 Medical Record Number:  [id number]   Referring MD:  Lorin Glass  Reason for visit: Alzheimer's dementia   Chief Complaint  Patient presents with   Follow-up    RM 3 with daughter charmaine Pt is well and memory stable, little more aggravated and still not sleeping any better       HPI:  Today, 11/14/2020, Lindsay Scott returns for 6 month follow up accompanied by her daughter.  Cognition overall stable without worsening.  Continues to have difficulty with sleeping - she will sleep good for 3-4 hours but then will not be able to go back to sleep. Also having increased anxiety and agitation.  She continues live with her daughter who is her main caregiver but does have a Comptroller during the day.  She is able to maintain ADLs independently.  Continues on trazodone 75mg  daily and Aricept 10mg  daily.  No new concerns at this time    MMSE - Mini Mental State Exam 11/14/2020 05/11/2020 11/09/2019  Orientation to time 5 5 3   Orientation to Place 4 5 4   Registration 3 3 3   Attention/ Calculation 4 1 0  Recall 2 2 3   Language- name 2 objects 2 2 2   Language- repeat 0 1 0  Language- follow 3 step command 2 3 2   Language- read & follow direction 1 1 1   Write a sentence 1 1 1   Copy design 1 0 1  Total score 25 24 20      History provided for reference purposes only Update 05/11/2020 JM: Lindsay Scott returns for follow-up after prior visit approximately 4 months ago.  She is accompanied by her daughter [name]  Per daughter, she has been doing very well since prior visit and believes she has even been improving especially since starting Aricept.  She is independent with ADLs, ambulates without assistive device and denies any recent falls.  She does have underlying anxiety and insomnia but no other behavioral concerns.  On  trazodone 50 mg nightly with some improvement of her sleep -currently sleeping approximately 4 hours at a time where previously would only sleep for 1 to 2 hours.  Trazodone has also helped with anxiety. MMSE today 24/30 (prior 20/30)  Other concerns: Continued weight loss approximately 10 pounds since prior visit as she was limited in what she can eat as she was unable to use her dentures.  She is currently working with dentist to get new dentures and is hopeful to have these within the next 1 to 2 weeks.  Since prior visit, she was seen at [place], for bleeding colonic polyps - removed 2 during admission - since then, no further bleeding issues  Seen rheumatology Monday for further evaluation of arthritis.  She had limited use of her hands with increased swelling and severe pain limiting ADLs and daily functioning. Was started on meloxicam and prednisone for total of 5 days - daughter is not sure if they are supposed to discontinue after that time or if they were supposed to follow-up.  Apparently, patient made a comment regarding use of meloxicam and being on Eliquis but per patient and daughter, he did not express any concerns.  She has not noticed any issues with bleeding or increased bruising  Update 01/18/2020 JM: She returns for follow-up after last visit 2 months ago.  She is accompanied by her daughter.  Patient had trouble tolerating Namenda and developed increased hallucinations and felt like she was drunk.  She states she also broke out into sores.  She called the office and we advised her to taper and stop it.  She is started feeling better immediately after that.  Patient was then started on Aricept and she is currently taking 5 mg daily since 12/31/2019 and she states states she is already feeling better.  Her bad dreams are gone and hallucinations have also improved.  She is tolerating it well without upset stomach nausea or diarrhea.  She is however been progressively losing weight  for the last 6 months or so.  She however denies lack of energy or tiredness.  She underwent EEG on 12/06/2019 which showed mild generalized slowing without epileptiform activity.  Lab work on 11/09/2019 showed normal vitamin B12, TSH and RPR.  MRI scan of the brain on 11/01/2019 showed no acute abnormalities.  Patient has no new complaints.   Initial visit 11/09/2019 Dr. Pearlean Brownie: Lindsay Scott is a pleasant 86 year old Caucasian Scott seen today for initial office consultation visit for aphasia.  She is accompanied by her daughter.  History is obtained from them, review of electronic medical records and I personally reviewed pertinent available imaging films in PACS.  She has past medical history of nonrheumatic aortic stenosis, s/p TAVR, paroxysmal A. fib on chronic anticoagulation with Eliquis, hypertension, coronary artery disease.  [facility] with a 10-day history of worsening speech and difficulty articulating and dizziness.  Lab work was significant only for low potassium of 2.9 and CT scan of the head showed no acute abnormality and CT angiogram showed no significant large vessel intracranial extracranial stenosis.  MRI scan of the brain was also obtained which showed no acute abnormalities.  LDL cholesterol 94 mg percent hemoglobin A1c was 5.5.  B12 and TSH were normal.  Echo was not done.  Patient had been started on Lexapro which was discontinued in the hospital and her speech difficulties improved.  However the daughter feels that she has noticed significant cognitive decline and memory difficulties since then.  On inquiry she states feels that this has been going on but has clearly worsened in the last 1 month.  Patient requires information to be given multiple times since she still forgets.  She is required more help with activities of daily living recently.  She does also occasionally get dizzy and feels avoid a straight into the right.  She is also seeing shadows in her  room particularly at nighttime.  On Mini-Mental status exam today she scored 20/30 in the office and then significant problems with visual-spatial skills, animal naming and drawing a clock.  She is not had any work-up for reversible causes of memory loss or treatment for Alzheimer's.  There is no family stable tremors.  Patient has no prior history of known stroke, TIA, seizures, significant head injury with loss of consciousness.  She has not exhibited any unsafe behavior.  She denies any delusions.  She is mostly calm and well behaved but does get irritable and agitated at times but there is no violent behavior.  She is not demonstrated tendency to wander off.  She does not drive.  ROS:   14 system review of systems is positive for those listed in HPI and all other systems negative    PMH:  Past Medical History:  Diagnosis Date   Atrial fibrillation (HCC)  Coronary artery disease    Hypertension    Nonrheumatic aortic (valve) stenosis     Social History:  Social History   Socioeconomic History   Marital status: Widowed    Spouse name: Not on file   Number of children: Not on file   Years of education: Not on file   Highest education level: Not on file  Occupational History   Not on file  Tobacco Use   Smoking status: Former    Types: Cigarettes    Quit date: 2003    Years since quitting: 19.7   Smokeless tobacco: Never  Vaping Use   Vaping Use: Never used  Substance and Sexual Activity   Alcohol use: Not Currently   Drug use: Never   Sexual activity: Not Currently  Other Topics Concern   Not on file  Social History Narrative   Lives with daughter [name]   Right Handed   Drinks no caffeine   Social Determinants of Health   Financial Resource Strain: Not on file  Food Insecurity: Not on file  Transportation Needs: Not on file  Physical Activity: Not on file  Stress: Not on file  Social Connections: Not on file  Intimate Partner Violence: Not on file     Medications:   Current Outpatient Medications on File Prior to Visit  Medication Sig Dispense Refill   apixaban (ELIQUIS) 2.5 MG TABS tablet Take by mouth 2 (two) times daily.     folic acid (FOLVITE) 1 MG tablet Take 1 mg by mouth daily.     furosemide (LASIX) 20 MG tablet Take 1 tablet (20 mg total) by mouth daily as needed. (Patient taking differently: Take 20 mg by mouth daily as needed. 1 tab mon, wed, fri. 1/2 other days) 30 tablet    meloxicam (MOBIC) 7.5 MG tablet as needed.      metoprolol tartrate (LOPRESSOR) 50 MG tablet Take 50 mg by mouth 2 (two) times daily.     omeprazole (PRILOSEC) 20 MG capsule Take 20 mg by mouth daily before breakfast.     potassium chloride (KLOR-CON) 10 MEQ tablet Take 10 mEq by mouth 2 (two) times daily.     Vitamin D, Ergocalciferol, (DRISDOL) 1.25 MG (50000 UNIT) CAPS capsule Take 50,000 Units by mouth every Sunday.     No current facility-administered medications on file prior to visit.    Allergies:   Allergies  Allergen Reactions   Betadine [Povidone Iodine] Other (See Comments)   Latex Other (See Comments)    unkn   Penicillins Swelling   Sulfa Antibiotics Other (See Comments)    unkn    Physical Exam Today's Vitals   11/14/20 1231  BP: (!) 155/80  Pulse: 61  Weight: 108 lb (49 kg)  Height: 4\' 11"  (1.499 m)    Body mass index is 21.81 kg/m.  General: Frail very pleasant elderly Caucasian Scott seated, in no evident distress Head: head normocephalic and atraumatic.   Neck: supple with no carotid or supraclavicular bruits Cardiovascular: regular rate and rhythm, no murmurs Musculoskeletal: Multiple bilateral hand arthritic nodules Skin:  no rash/petichiae Vascular:  Normal pulses all extremities  Neurologic Exam Mental Status: Awake and fully alert. Oriented to place and time. Recent memory diminished and remote memory intact. Attention span, concentration and fund of knowledge slightly impaired. Mood and affect  appropriate. Speech is hesitant with at times word finding difficulty.  MMSE 25/30 (prior 24/30)  Cranial Nerves: Pupils equal, briskly reactive to light. Extraocular movements full without nystagmus.  Visual fields full to confrontation. Hearing diminished bilaterally.  Facial sensation intact. Face, tongue, palate moves normally and symmetrically.  Motor: Normal bulk and tone. Normal strength in all tested extremity muscles.  No evidence of action or resting tremors today.  No cogwheel rigidity.  No bradykinesia. Sensory.: intact to touch , pinprick , position and vibratory sensation.  Coordination: Rapid alternating movements normal in all extremities. Finger-to-nose and heel-to-shin performed accurately bilaterally. Gait and Station: Arises from chair without difficulty. Stance is slightly stooped. Gait demonstrates normal stride length and balance without use of assistive device.  Reflexes: 1+ and symmetric. Toes downgoing.      ASSESSMENT/PLAN: Lindsay Scott is a very pleasant 86 year old Caucasian Scott with progressive memory, cognitive and speech issues evaluated by Dr. Pearlean Brownie 11/09/2019 likely from underlying mild Alzheimer's dementia     Alzheimer's dementia -Cognition stable MMSE today 24/30 (prior 20/30) -Continue Aricept 10 mg nightly -Continue sleep difficulty and anxiety with agitation -increase trazodone from 75 mg nightly to 100mg  nightly.  Discussed potential side effects and daughter and patient wished to proceed  -Dementia panel WNL -MR brain 10/2019 frontal lobe cerebral atrophy with no acute abnormalities -EEG mild slowing but no seizure activity noted -Intolerant to 11/2019 - reported visual hallucinations, "drunk" and rash    Follow-up in 6 months or call earlier if needed    CC:  [facility] provider: Dr. [employer], MD   I spent 32 minutes of face-to-face and non-face-to-face time with patient and daughter.  This included previsit chart review, lab review,  study review, order entry, electronic health record documentation, patient education and discussion regarding Alzheimer's dementia with completion and review of MMSE, use of trazodone for insomnia and anxiety and possible side effects and answered all other questions to patient and daughter satisfaction  Lorna Dibble, AGNP-BC  [facility] [address] [place], [place]  Phone [phone] Fax [phone] Note: This document was prepared with digital dictation and possible smart phrase technology. Any transcriptional errors that result from this process are unintentional.

## 2020-11-14 NOTE — Patient Instructions (Signed)
Increase trazodone to 100mg  to further help with sleep and anxiety  Continue aricept 10mg  nightly     Followup in the future with me in 6 months or call earlier if needed       Thank you for coming to see at [facility]. I hope we have been able to provide you high quality care today.  You may receive a patient satisfaction survey over the next few weeks. We would appreciate your feedback and comments so that we may continue to improve ourselves and the health of our patients.

## 2020-12-14 ENCOUNTER — Ambulatory Visit: Payer: Medicare Other | Admitting: "Endocrinology

## 2021-05-15 ENCOUNTER — Ambulatory Visit (INDEPENDENT_AMBULATORY_CARE_PROVIDER_SITE_OTHER): Payer: Medicare Other | Admitting: Adult Health

## 2021-05-15 ENCOUNTER — Encounter: Payer: Self-pay | Admitting: Adult Health

## 2021-05-15 VITALS — BP 128/70 | HR 61 | Ht 59.0 in | Wt 105.0 lb

## 2021-05-15 DIAGNOSIS — G309 Alzheimer's disease, unspecified: Secondary | ICD-10-CM | POA: Diagnosis not present

## 2021-05-15 DIAGNOSIS — F0280 Dementia in other diseases classified elsewhere without behavioral disturbance: Secondary | ICD-10-CM

## 2021-05-15 MED ORDER — SERTRALINE HCL 25 MG PO TABS: 25.0000 mg | ORAL_TABLET | Freq: Every day | ORAL | 5 refills | Status: DC

## 2021-05-15 NOTE — Progress Notes (Signed)
?[name] ?[id number] ?[place]. [place] [address] ?[phone] ? ?     OFFICE FOLLOW UP VISIT NOTE ? ?[name] ?Date of Birth:  [birth year] ?Medical Record Number:  [id number]  ? ?Referring MD:  Lorin Glass ? ?Reason for visit: Alzheimer's dementia ? ? ?Chief Complaint  ?Patient presents with  ? Follow-up  ?  RM 3 With daughter chairmine ?Pt is well and stable, daughter mentions she is more agitated   ? ?  ? ?HPI: ? ?Update 05/15/2021 JM: Patient returns for 43-month dementia follow-up accompanied by her daughter.  Reports cognition stable since prior visit.  MMSE today 24/30 (prior 25/30).  Daughter reports she has been more agitated which patient also agrees with. She is frustrated due to her current health condition with decline in hearing and vision, chronic pain, different medications, etc. she does have underlying history of anxiety and depression and has been on Lexapro before but per daughter, after dosage increase, she started to experience worsening dizziness, slurred speech and word finding difficulty which prompted the ED evaluation on 9/11 but per ED note, unclear if symptoms related to dosage increase but either way this mediation was stopped. Per pt and daughter, they spoke to PCP yesterday about likely underlying depression/anxiety but was told to "talk to your dementia doctor about it". She she has remained on Aricept, denies side effects.  Remains on trazodone 100 mg nightly, denies side effects, does endorse some improvement of sleep but can still have some difficulty. She does still have a caregiver during the day.  Maintains ADLs independently.  Needs assistance for IADLs.  No further concerns at this time. ? ? ? ? ?History provided for reference purposes only ?Update 11/14/2020 JM: [name] returns for 6 month follow up accompanied by her daughter.  Cognition overall stable without worsening.  Continues to have difficulty with sleeping - she will sleep good  for 3-4 hours but then will not be able to go back to sleep. Also having increased anxiety and agitation.  She continues live with her daughter who is her main caregiver but does have a Comptroller during the day.  She is able to maintain ADLs independently.  Continues on trazodone 75mg  daily and Aricept 10mg  daily.  No new concerns at this time ? ?Update 05/11/2020 JM: [name] returns for follow-up after prior visit approximately 4 months ago.  She is accompanied by her daughter [name] ? ?Per daughter, she has been doing very well since prior visit and believes she has even been improving especially since starting Aricept.  She is independent with ADLs, ambulates without assistive device and denies any recent falls.  She does have underlying anxiety and insomnia but no other behavioral concerns.  On trazodone 50 mg nightly with some improvement of her sleep -currently sleeping approximately 4 hours at a time where previously would only sleep for 1 to 2 hours.  Trazodone has also helped with anxiety. ?MMSE today 24/30 (prior 20/30) ? ?Other concerns: ?Continued weight loss approximately 10 pounds since prior visit as she was limited in what she can eat as she was unable to use her dentures.  She is currently working with dentist to get new dentures and is hopeful to have these within the next 1 to 2 weeks. ? ?Since prior visit, she was seen at [place], [facility] for bleeding colonic polyps - removed 2 during admission - since then, no further bleeding issues ? ?Seen rheumatology Monday for further evaluation of arthritis.  She had limited use of her hands with increased swelling and severe pain limiting ADLs and daily functioning. Was started on meloxicam and prednisone for total of 5 days - daughter is not sure if they are supposed to discontinue after that time or if they were supposed to follow-up.  Apparently, patient made a comment regarding use of meloxicam and being on Eliquis but per patient and daughter, he did not  express any concerns.  She has not noticed any issues with bleeding or increased bruising ? ?Update 01/18/2020 JM: She returns for follow-up after last visit 2 months ago.  She is accompanied by her daughter.  Patient had trouble tolerating Namenda and developed increased hallucinations and felt like she was drunk.  She states she also broke out into sores.  She called the office and we advised her to taper and stop it.  She is started feeling better immediately after that.  Patient was then started on Aricept and she is currently taking 5 mg daily since 12/31/2019 and she states states she is already feeling better.  Her bad dreams are gone and hallucinations have also improved.  She is tolerating it well without upset stomach nausea or diarrhea.  She is however been progressively losing weight for the last 6 months or so.  She however denies lack of energy or tiredness.  She underwent EEG on 12/06/2019 which showed mild generalized slowing without epileptiform activity.  Lab work on 11/09/2019 showed normal vitamin B12, TSH and RPR.  MRI scan of the brain on 11/01/2019 showed no acute abnormalities.  Patient has no new complaints. ? ? Initial visit 11/09/2019 Dr. Pearlean BrownieSethi: [name] is a pleasant 87 year old Caucasian lady seen today for initial office consultation visit for aphasia.  She is accompanied by her daughter.  History is obtained from them, review of electronic medical records and I personally reviewed pertinent available imaging films in PACS.  She has past medical history of nonrheumatic aortic stenosis, s/p TAVR, paroxysmal A. fib on chronic anticoagulation with Eliquis, hypertension, coronary artery disease.  She presented to [facility] on 10/30/2019 with a 10-day history of worsening speech and difficulty articulating and dizziness.  Lab work was significant only for low potassium of 2.9 and CT scan of the head showed no acute abnormality and CT angiogram showed no significant large vessel  intracranial extracranial stenosis.  MRI scan of the brain was also obtained which showed no acute abnormalities.  LDL cholesterol 94 mg percent hemoglobin A1c was 5.5.  B12 and TSH were normal.  Echo was not done.  Patient had been started on Lexapro which was discontinued in the hospital and her speech difficulties improved.  However the daughter feels that she has noticed significant cognitive decline and memory difficulties since then.  On inquiry she states feels that this has been going on but has clearly worsened in the last 1 month.  Patient requires information to be given multiple times since she still forgets.  She is required more help with activities of daily living recently.  She does also occasionally get dizzy and feels avoid a straight into the right.  She is also seeing shadows in her room particularly at nighttime.  On Mini-Mental status exam today she scored 20/30 in the office and then significant problems with visual-spatial skills, animal naming and drawing a clock.  She is not had any work-up for reversible causes of memory loss or treatment for Alzheimer's.  There is no family stable tremors.  Patient has no  prior history of known stroke, TIA, seizures, significant head injury with loss of consciousness.  She has not exhibited any unsafe behavior.  She denies any delusions.  She is mostly calm and well behaved but does get irritable and agitated at times but there is no violent behavior.  She is not demonstrated tendency to wander off.  She does not drive. ? ? ? ?ROS:   ?14 system review of systems is positive for those listed in HPI and all other systems negative ? ? ? ?PMH:  ?Past Medical History:  ?Diagnosis Date  ? Atrial fibrillation (HCC)   ? Coronary artery disease   ? Hypertension   ? Nonrheumatic aortic (valve) stenosis   ? ? ?Social History:  ?Social History  ? ?Socioeconomic History  ? Marital status: Widowed  ?  Spouse name: Not on file  ? Number of children: Not on file  ? Years  of education: Not on file  ? Highest education level: Not on file  ?Occupational History  ? Not on file  ?Tobacco Use  ? Smoking status: Former  ?  Types: Cigarettes  ?  Quit date: 2003  ?  Years since quit

## 2021-05-15 NOTE — Patient Instructions (Addendum)
Recommend starting sertraline 25mg  nightly to help with anxiety  ? ?Please monitor for any worsening agitation, confusion, high blood pressure or muscle weakness  ? ?Continue Aricept and trazodone  ? ? ? ? ? ? ?Followup in the future with me in 6 months or call earlier if needed ? ? ? ? ? ? ?[facility] you [facility]. I hope we have been able to provide you high quality care today. ? ?You may receive a patient satisfaction survey over the next few weeks. We would appreciate your feedback and comments so that we may continue to improve ourselves and the health of our patients. ? ? ? ? ?Sertraline Tablets ?What is this medication? ?SERTRALINE (SER tra leen) treats depression, anxiety, obsessive-compulsive disorder (OCD), post-traumatic stress disorder (PTSD), and premenstrual dysphoric disorder (PMDD). It increases the amount of serotonin in the brain, a hormone that helps regulate mood. It belongs to a group of medications called SSRIs. ?This medicine may be used for other purposes; ask your health care provider or pharmacist if you have questions. ?COMMON BRAND NAME(S): Zoloft ?What should I tell my care team before I take this medication? ?They need to know if you have any of these conditions: ?Bleeding disorders ?Bipolar disorder or a family history of bipolar disorder ?Frequently drink alcohol ?Glaucoma ?Heart disease ?High blood pressure ?History of irregular heartbeat ?History of low levels of calcium, magnesium, or potassium in the blood ?Liver disease ?Receiving electroconvulsive therapy ?Seizures ?Suicidal thoughts, plans, or attempt; a previous suicide attempt by you or a family member ?Take medications that prevent or treat blood clots ?Thyroid disease ?An unusual or allergic reaction to sertraline, other medications, foods, dyes, or preservatives ?Pregnant or trying to get pregnant ?Breast-feeding ?How should I use this medication? ?Take this medication by mouth with  a glass of water. Follow the directions on the prescription label. You can take it with or without food. Take your medication at regular intervals. Do not take your medication more often than directed. Do not stop taking this medication suddenly except upon the advice of your care team. Stopping this medication too quickly may cause serious side effects or your condition may worsen. ?A special MedGuide will be given to you by the pharmacist with each prescription and refill. Be sure to read this information carefully each time. ?Talk to your care team about the use of this medication in children. While this medication may be prescribed for children as young as 7 years for selected conditions, precautions do apply. ?Overdosage: If you think you have taken too much of this medicine contact a poison control center or emergency room at once. ?NOTE: This medicine is only for you. Do not share this medicine with others. ?What if I miss a dose? ?If you miss a dose, take it as soon as you can. If it is almost time for your next dose, take only that dose. Do not take double or extra doses. ?What may interact with this medication? ?Do not take this medication with any of the following: ?Cisapride ?Dronedarone ?Linezolid ?MAOIs like Carbex, Eldepryl, Marplan, Nardil, and Parnate ?Methylene blue (injected into a vein) ?Pimozide ?Thioridazine ?This medication may also interact with the following: ?Alcohol ?Amphetamines ?Aspirin and aspirin-like medications ?Certain medications for depression, anxiety, or other mental health conditions ?Certain medications for fungal infections like ketoconazole, fluconazole, posaconazole, and itraconazole ?Certain medications for irregular heart beat like flecainide, quinidine, propafenone ?Certain medications for migraine headaches like almotriptan, eletriptan, frovatriptan, naratriptan, rizatriptan,  sumatriptan, zolmitriptan ?Certain medications for sleep ?Certain medications for seizures like  carbamazepine, valproic acid, phenytoin ?Certain medications that treat or prevent blood clots like warfarin, enoxaparin, dalteparin ?Cimetidine ?Digoxin ?Diuretics ?Fentanyl ?Isoniazid ?Lithium ?NSAIDs, medications for pain and inflammation, like ibuprofen or naproxen ?Other medications that prolong the QT interval (cause an abnormal heart rhythm) like dofetilide ?Rasagiline ?Safinamide ?Supplements like St. John's wort, kava kava, valerian ?Tolbutamide ?Tramadol ?Tryptophan ?This list may not describe all possible interactions. Give your health care provider a list of all the medicines, herbs, non-prescription drugs, or dietary supplements you use. Also tell them if you smoke, drink alcohol, or use illegal drugs. Some items may interact with your medicine. ?What should I watch for while using this medication? ?Tell your care team if your symptoms do not get better or if they get worse. Visit your care team for regular checks on your progress. Because it may take several weeks to see the full effects of this medication, it is important to continue your treatment as prescribed by your care team. ?Patients and their families should watch out for new or worsening thoughts of suicide or depression. Also watch out for sudden changes in feelings such as feeling anxious, agitated, panicky, irritable, hostile, aggressive, impulsive, severely restless, overly excited and hyperactive, or not being able to sleep. If this happens, especially at the beginning of treatment or after a change in dose, call your care team. ?This medication may affect your coordination, reaction time, or judgment. Do not drive or operate machinery until you know how this medication affects you. Sit or stand up slowly to reduce the risk of dizzy or fainting spells. Drinking alcohol with this medication can increase the risk of these side effects. ?Your mouth may get dry. Chewing sugarless gum or sucking hard candy, and drinking plenty of water may  help. Contact your care team if the problem does not go away or is severe. ?What side effects may I notice from receiving this medication? ?Side effects that you should report to your care team as soon as possible: ?Allergic reactions--skin rash, itching, hives, swelling of the face, lips, tongue, or throat ?Bleeding--bloody or black, tar-like stools, red or dark brown urine, vomiting blood or brown material that looks like coffee grounds, small red or purple spots on skin, unusual bleeding or bruising ?Heart rhythm changes--fast or irregular heartbeat, dizziness, feeling faint or lightheaded, chest pain, trouble breathing ?Low sodium level--muscle weakness, fatigue, dizziness, headache, confusion ?Serotonin syndrome--irritability, confusion, fast or irregular heartbeat, muscle stiffness, twitching muscles, sweating, high fever, seizure, chills, vomiting, diarrhea ?Sudden eye pain or change in vision such as blurred vision, seeing halos around lights, vision loss ?Thoughts of suicide or self-harm, worsening mood ?Side effects that usually do not require medical attention (report these to your care team if they continue or are bothersome): ?Change in sex drive or performance ?Diarrhea ?Excessive sweating ?Nausea ?Tremors or shaking ?Upset stomach ?This list may not describe all possible side effects. Call your doctor for medical advice about side effects. You may report side effects to FDA at 1-800-FDA-1088. ?Where should I keep my medication? ?Keep out of the reach of children and pets. ?Store at room temperature between 15 and 30 degrees C (59 and 86 degrees F). Get rid of any unused medication after the expiration date. ?To get rid of medications that are no longer needed or expired: ?Take the medication to a medication take-back program. Check with your pharmacy or law enforcement to find a location. ?If you cannot return  the medication, check the label or package insert to see if the medication should be thrown  out in the garbage or flushed down the toilet. If you are not sure, ask your care team. If it is safe to put in the trash, empty the medication out of the container. Mix the medication with cat litter

## 2021-08-07 ENCOUNTER — Other Ambulatory Visit: Payer: Self-pay | Admitting: *Deleted

## 2021-08-07 MED ORDER — TRAZODONE HCL 100 MG PO TABS: 100.0000 mg | ORAL_TABLET | Freq: Every day | ORAL | 2 refills | Status: DC

## 2021-09-27 ENCOUNTER — Other Ambulatory Visit: Payer: Self-pay | Admitting: *Deleted

## 2021-09-27 DIAGNOSIS — F0280 Dementia in other diseases classified elsewhere without behavioral disturbance: Secondary | ICD-10-CM

## 2021-09-27 MED ORDER — DONEPEZIL HCL 10 MG PO TABS: 10.0000 mg | ORAL_TABLET | Freq: Every day | ORAL | 2 refills | Status: DC

## 2021-11-05 ENCOUNTER — Other Ambulatory Visit: Payer: Self-pay

## 2021-11-05 MED ORDER — SERTRALINE HCL 25 MG PO TABS: 25.0000 mg | ORAL_TABLET | Freq: Every day | ORAL | 5 refills | Status: DC

## 2021-11-14 NOTE — Progress Notes (Unsigned)
[name] [address] [place]. [place] [address] [phone]       OFFICE FOLLOW UP VISIT NOTE  Ms. Raeya Merritts Date of Birth:  [birth year] Medical Record Number:  [id number]   Referring MD:  Lorin Glass  Reason for visit: Alzheimer's dementia   No chief complaint on file.     HPI:  Update 11/15/2021 JM: Patient returns for 85-month dementia follow-up.        History provided for reference purposes only Update 05/15/2021 JM: Patient returns for 85-month dementia follow-up accompanied by her daughter.  Reports cognition stable since prior visit.  MMSE today 24/30 (prior 25/30).  Daughter reports she has been more agitated which patient also agrees with. She is frustrated due to her current health condition with decline in hearing and vision, chronic pain, different medications, etc. she does have underlying history of anxiety and depression and has been on Lexapro before but per daughter, after dosage increase, she started to experience worsening dizziness, slurred speech and word finding difficulty which prompted the ED evaluation on 9/11 but per ED note, unclear if symptoms related to dosage increase but either way this mediation was stopped. Per pt and daughter, they spoke to PCP yesterday about likely underlying depression/anxiety but was told to "talk to your dementia doctor about it". She she has remained on Aricept, denies side effects.  Remains on trazodone 100 mg nightly, denies side effects, does endorse some improvement of sleep but can still have some difficulty. She does still have a caregiver during the day.  Maintains ADLs independently.  Needs assistance for IADLs.  No further concerns at this time.  Update 11/14/2020 JM: Ms. Descoteaux returns for 6 month follow up accompanied by her daughter.  Cognition overall stable without worsening.  Continues to have difficulty with sleeping - she will sleep good for 3-4 hours but then will not be able to go  back to sleep. Also having increased anxiety and agitation.  She continues live with her daughter who is her main caregiver but does have a Comptroller during the day.  She is able to maintain ADLs independently.  Continues on trazodone 75mg  daily and Aricept 10mg  daily.  No new concerns at this time  Update 05/11/2020 JM: Ms. Summerson returns for follow-up after prior visit approximately 4 months ago.  She is accompanied by her daughter [name]  Per daughter, she has been doing very well since prior visit and believes she has even been improving especially since starting Aricept.  She is independent with ADLs, ambulates without assistive device and denies any recent falls.  She does have underlying anxiety and insomnia but no other behavioral concerns.  On trazodone 50 mg nightly with some improvement of her sleep -currently sleeping approximately 4 hours at a time where previously would only sleep for 1 to 2 hours.  Trazodone has also helped with anxiety. MMSE today 24/30 (prior 20/30)  Other concerns: Continued weight loss approximately 10 pounds since prior visit as she was limited in what she can eat as she was unable to use her dentures.  She is currently working with dentist to get new dentures and is hopeful to have these within the next 1 to 2 weeks.  Since prior visit, she was seen at [facility], [facility] for bleeding colonic polyps - removed 2 during admission - since then, no further bleeding issues  Seen rheumatology Monday for further evaluation of arthritis.  She had limited use of her hands with increased swelling and severe  pain limiting ADLs and daily functioning. Was started on meloxicam and prednisone for total of 5 days - daughter is not sure if they are supposed to discontinue after that time or if they were supposed to follow-up.  Apparently, patient made a comment regarding use of meloxicam and being on Eliquis but per patient and daughter, he did not express any concerns.  She has not noticed any  issues with bleeding or increased bruising  Update 01/18/2020 JM: She returns for follow-up after last visit 2 months ago.  She is accompanied by her daughter.  Patient had trouble tolerating Namenda and developed increased hallucinations and felt like she was drunk.  She states she also broke out into sores.  She called the office and we advised her to taper and stop it.  She is started feeling better immediately after that.  Patient was then started on Aricept and she is currently taking 5 mg daily since 12/31/2019 and she states states she is already feeling better.  Her bad dreams are gone and hallucinations have also improved.  She is tolerating it well without upset stomach nausea or diarrhea.  She is however been progressively losing weight for the last 6 months or so.  She however denies lack of energy or tiredness.  She underwent EEG on 12/06/2019 which showed mild generalized slowing without epileptiform activity.  Lab work on 11/09/2019 showed normal vitamin B12, TSH and RPR.  MRI scan of the brain on 11/01/2019 showed no acute abnormalities.  Patient has no new complaints.   Initial visit 11/09/2019 Dr. Pearlean BrownieSethi: Ms. Melvyn NethLewis is a pleasant 87 year old Caucasian lady seen today for initial office consultation visit for aphasia.  She is accompanied by her daughter.  History is obtained from them, review of electronic medical records and I personally reviewed pertinent available imaging films in PACS.  She has past medical history of nonrheumatic aortic stenosis, s/p TAVR, paroxysmal A. fib on chronic anticoagulation with Eliquis, hypertension, coronary artery disease.  She [facility] to [facility]30/2019 with a 10-day history of worsening speech and difficulty articulating and dizziness.  Lab work was significant only for low potassium of 2.9 and CT scan of the head showed no acute abnormality and CT angiogram showed no significant large vessel intracranial extracranial stenosis.  MRI scan of the  brain was also obtained which showed no acute abnormalities.  LDL cholesterol 94 mg percent hemoglobin A1c was 5.5.  B12 and TSH were normal.  Echo was not done.  Patient had been started on Lexapro which was discontinued in the hospital and her speech difficulties improved.  However the daughter feels that she has noticed significant cognitive decline and memory difficulties since then.  On inquiry she states feels that this has been going on but has clearly worsened in the last 1 month.  Patient requires information to be given multiple times since she still forgets.  She is required more help with activities of daily living recently.  She does also occasionally get dizzy and feels avoid a straight into the right.  She is also seeing shadows in her room particularly at nighttime.  On Mini-Mental status exam today she scored 20/30 in the office and then significant problems with visual-spatial skills, animal naming and drawing a clock.  She is not had any work-up for reversible causes of memory loss or treatment for Alzheimer's.  There is no family stable tremors.  Patient has no prior history of known stroke, TIA, seizures, significant head injury with loss  of consciousness.  She has not exhibited any unsafe behavior.  She denies any delusions.  She is mostly calm and well behaved but does get irritable and agitated at times but there is no violent behavior.  She is not demonstrated tendency to wander off.  She does not drive.    ROS:   14 system review of systems is positive for those listed in HPI and all other systems negative    PMH:  Past Medical History:  Diagnosis Date   Atrial fibrillation (HCC)    Coronary artery disease    Hypertension    Nonrheumatic aortic (valve) stenosis     Social History:  Social History   Socioeconomic History   Marital status: Widowed    Spouse name: Not on file   Number of children: Not on file   Years of education: Not on file   Highest education  level: Not on file  Occupational History   Not on file  Tobacco Use   Smoking status: Former    Types: Cigarettes    Quit date: 2003    Years since quitting: 20.7   Smokeless tobacco: Never  Vaping Use   Vaping Use: Never used  Substance and Sexual Activity   Alcohol use: Not Currently   Drug use: Never   Sexual activity: Not Currently  Other Topics Concern   Not on file  Social History Narrative   Lives with daughter [name]   Right Handed   Drinks no caffeine   Social Determinants of Health   Financial Resource Strain: Not on file  Food Insecurity: Not on file  Transportation Needs: Not on file  Physical Activity: Not on file  Stress: Not on file  Social Connections: Not on file  Intimate Partner Violence: Not on file    Medications:   Current Outpatient Medications on File Prior to Visit  Medication Sig Dispense Refill   apixaban (ELIQUIS) 2.5 MG TABS tablet Take by mouth 2 (two) times daily.     donepezil (ARICEPT) 10 MG tablet Take 1 tablet (10 mg total) by mouth at bedtime. 90 tablet 2   folic acid (FOLVITE) 1 MG tablet Take 1 mg by mouth daily.     furosemide (LASIX) 20 MG tablet Take 1 tablet (20 mg total) by mouth daily as needed. (Patient taking differently: Take 20 mg by mouth daily as needed. 1 tab mon, wed, fri. 1/2 other days) 30 tablet    meloxicam (MOBIC) 7.5 MG tablet as needed.      metoprolol tartrate (LOPRESSOR) 50 MG tablet Take 50 mg by mouth 2 (two) times daily.     omeprazole (PRILOSEC) 20 MG capsule Take 20 mg by mouth daily before breakfast.     potassium chloride (KLOR-CON) 10 MEQ tablet Take 10 mEq by mouth 2 (two) times daily.     sertraline (ZOLOFT) 25 MG tablet Take 1 tablet (25 mg total) by mouth at bedtime. 30 tablet 5   traZODone (DESYREL) 100 MG tablet Take 1 tablet (100 mg total) by mouth at bedtime. 90 tablet 2   Vitamin D, Ergocalciferol, (DRISDOL) 1.25 MG (50000 UNIT) CAPS capsule Take 50,000 Units by mouth every Sunday.     No  current facility-administered medications on file prior to visit.    Allergies:   Allergies  Allergen Reactions   Betadine [Povidone Iodine] Other (See Comments)   Latex Other (See Comments)    unkn   Penicillins Swelling   Sulfa Antibiotics Other (See Comments)  unkn    Physical Exam There were no vitals filed for this visit.  There is no height or weight on file to calculate BMI.   General: Frail very pleasant elderly Caucasian lady seated, in no evident distress Head: head normocephalic and atraumatic.   Neck: supple with no carotid or supraclavicular bruits Cardiovascular: irregular rate and rhythm, no murmurs Musculoskeletal: Multiple bilateral hand arthritic nodules Skin:  no rash/petichiae Vascular:  Normal pulses all extremities  Neurologic Exam Mental Status: Awake and fully alert. Recent memory diminished and remote memory intact. Attention span, concentration and fund of knowledge largely appropriate during visit. Mood and affect appropriate.  Hesitant speech but no true aphasia or dysarthria.     05/15/2021   12:40 PM 11/14/2020   12:41 PM 05/11/2020    3:43 PM  MMSE - Mini Mental State Exam  Orientation to time 3 5 5   Orientation to Place 5 4 5   Registration 3 3 3   Attention/ Calculation 3 4 1   Recall 3 2 2   Language- name 2 objects 2 2 2   Language- repeat 0 0 1  Language- follow 3 step command 3 2 3   Language- read & follow direction 1 1 1   Write a sentence 1 1 1   Copy design 0 1 0  Total score 24 25 24    Cranial Nerves: Pupils equal, briskly reactive to light. Extraocular movements full without nystagmus. Visual fields full to confrontation. Hearing diminished bilaterally.  Facial sensation intact. Face, tongue, palate moves normally and symmetrically.  Motor: Normal bulk and tone. Normal strength in all tested extremity muscles.  No evidence of action or resting tremors today.  No cogwheel rigidity.  No bradykinesia. Sensory.: intact to touch ,  pinprick , position and vibratory sensation.  Coordination: Rapid alternating movements normal in all extremities. Finger-to-nose and heel-to-shin performed accurately bilaterally. Gait and Station: Arises from chair without difficulty. Stance is slightly stooped. Gait demonstrates slightly decreased stride length and step height without use of assistive device.  Tandem walk and heel toe not attempted. Reflexes: 1+ and symmetric. Toes downgoing.      ASSESSMENT/PLAN: Lindsay Scott is a very pleasant 87 year old Caucasian lady with progressive memory, cognitive and speech issues evaluated by Dr. 11/09/2019 likely from underlying mild Alzheimer's dementia     Alzheimer's dementia -Cognition stable MMSE today 24/30 (prior 25/30) -start sertraline 25 mg nightly for likely underlying anxiety contributing to agitation -Continue trazodone 100 mg nightly -hesitant to increase further due to already high dose -did discuss potential side effects and to monitor for any symptoms such as worsening agitation, confusion, blood pressure or heart rate changes, muscle weakness, etc - full information provided to daughter. Per Up to Date,  Risk C: monitor therapy. As benefit would likely outweigh potential risk which was discussed with daughter and patient.  -Continue Aricept 10 mg nightly  -Dementia panel WNL -MR brain 10/2019 frontal lobe cerebral atrophy with no acute abnormalities -EEG mild slowing but no seizure activity noted -Intolerant to Namenda - reported visual hallucinations, "drunk" and rash    Follow-up in 6 months or call earlier if needed    CC:  , MD   I spent 36 minutes of face-to-face and non-face-to-face time with patient and daughter.  This included previsit chart review, lab review, study review, order entry, electronic health record documentation, patient and daughter education and discussion regarding Alzheimer's dementia with review and discussion of MMSE,  use of trazodone for insomnia and sertraline for anxiety and possible side  effects and answered all other questions to patient and daughter satisfaction  Frann Rider, [facility]  [facility] [address] [place], [place] [address]  Phone [phone] Fax [phone] Note: This document was prepared with digital dictation and possible smart phrase technology. Any transcriptional errors that result from this process are unintentional.

## 2021-11-15 ENCOUNTER — Ambulatory Visit (INDEPENDENT_AMBULATORY_CARE_PROVIDER_SITE_OTHER): Payer: Medicare Other | Admitting: Adult Health

## 2021-11-15 ENCOUNTER — Encounter: Payer: Self-pay | Admitting: Adult Health

## 2021-11-15 VITALS — BP 150/72 | HR 60 | Ht 59.0 in | Wt 109.1 lb

## 2021-11-15 DIAGNOSIS — G309 Alzheimer's disease, unspecified: Secondary | ICD-10-CM

## 2021-11-15 DIAGNOSIS — F0280 Dementia in other diseases classified elsewhere without behavioral disturbance: Secondary | ICD-10-CM | POA: Diagnosis not present

## 2021-11-15 NOTE — Patient Instructions (Addendum)
Your Plan:  Continue Aricept 10mg  nightly   Continue sertraline and trazodone at current dose   Would recommend trying melatonin 5mg  nightly - can go up to 10mg  nightly if needed    Follow up in 6 months or call earlier if needed     Thank you for coming to see [place] at [facility]. I hope we have been able to provide you high quality care today.  You may receive a patient satisfaction survey over the next few weeks. We would appreciate your feedback and comments so that we may continue to improve ourselves and the health of our patients.

## 2022-03-13 IMAGING — MR MR HEAD W/O CM
11 of 12 series · 41 of 48 positions shown · non-contrast
Comparison: Head CT 10/30/2019

CLINICAL DATA: Dizziness and slurred speech.

EXAM:
MRI HEAD WITHOUT CONTRAST
TECHNIQUE: Multiplanar, multiecho pulse sequences of the brain and surrounding
structures were obtained without intravenous contrast.

[Series 5: DWI · axial · 4.0mm · 0.88mm/px · z∈[-81,+58]mm · 5 of 36 slices shown (1 of 6)]
[im 1/36]
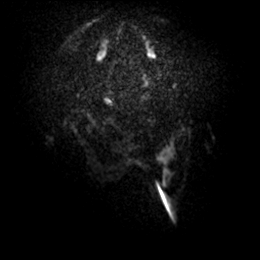
[im 9/36]
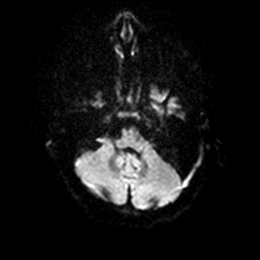
[im 18/36]
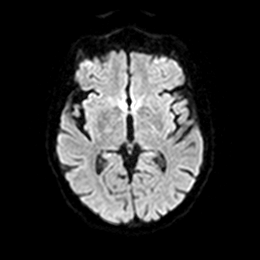
[im 27/36]
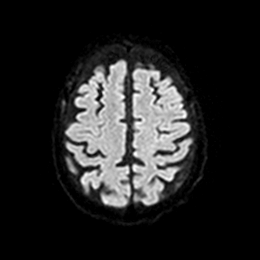
[im 36/36]
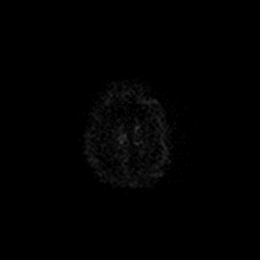

[Series 5: DWI · axial · 4.0mm · 0.88mm/px · z∈[-81,+58]mm · 5 of 36 slices shown (2 of 6)]
[im 1/36]
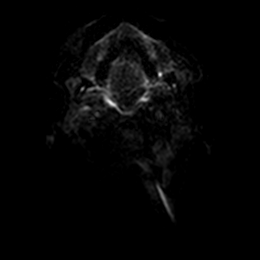
[im 9/36]
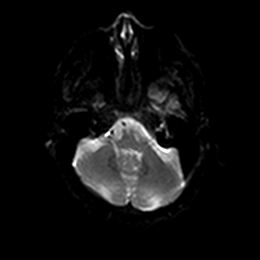
[im 18/36]
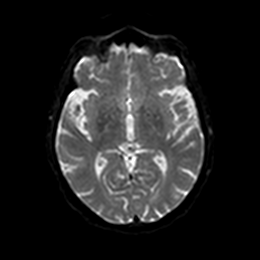
[im 27/36]
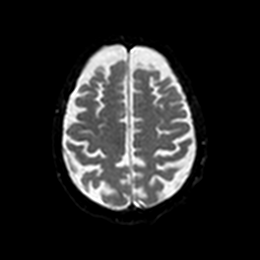
[im 36/36]
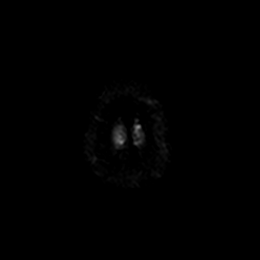

[Series 6: DWI · axial · 4.0mm · 0.88mm/px · z∈[-81,+58]mm · 4 of 36 slices shown (3 of 6)]
[im 1/36]
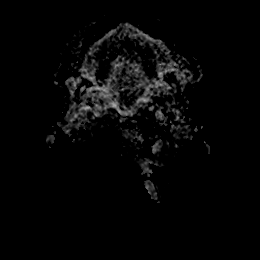
[im 12/36]
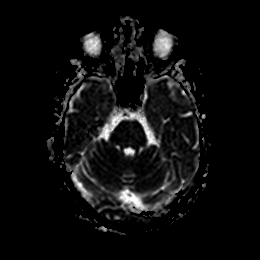
[im 24/36]
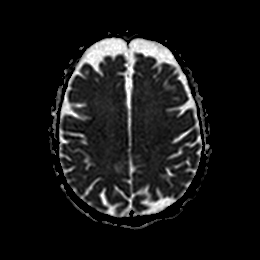
[im 36/36]
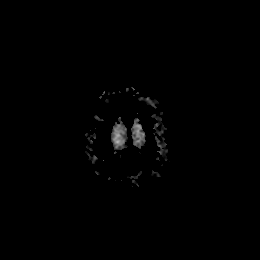

[Series 7: DWI · coronal · 4.0mm · 0.88mm/px · 4 of 32 slices shown (4 of 6)]
[im 1/32]
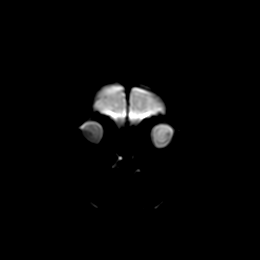
[im 11/32]
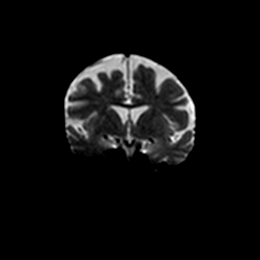
[im 21/32]
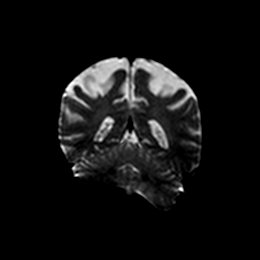
[im 32/32]
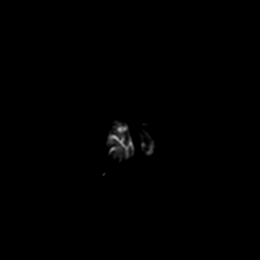

[Series 7: DWI · coronal · 4.0mm · 0.88mm/px · 4 of 32 slices shown (5 of 6)]
[im 1/32]
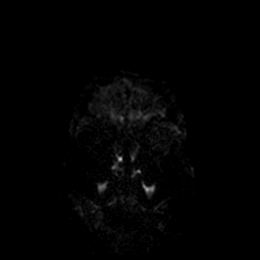
[im 11/32]
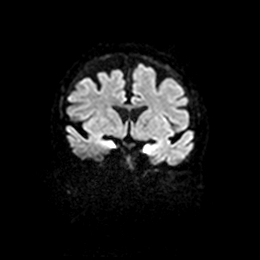
[im 21/32]
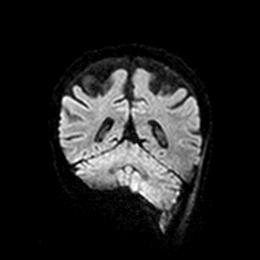
[im 32/32]
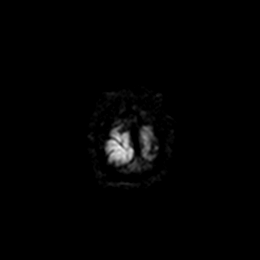

[Series 8: DWI · coronal · 4.0mm · 0.88mm/px · 4 of 32 slices shown (6 of 6)]
[im 1/32]
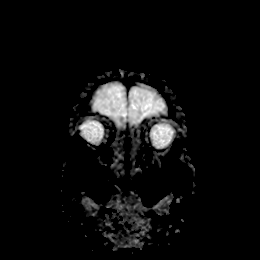
[im 11/32]
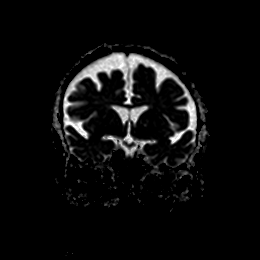
[im 21/32]
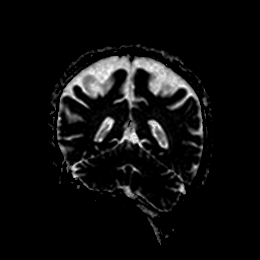
[im 32/32]
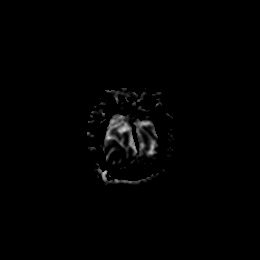

[Series 9: T1 · sagittal · 5.0mm · 0.94mm/px · 3 of 25 slices shown]
[im 1/25]
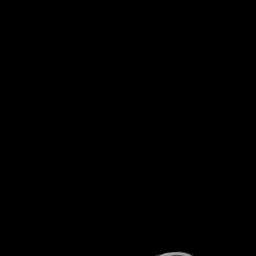
[im 13/25]
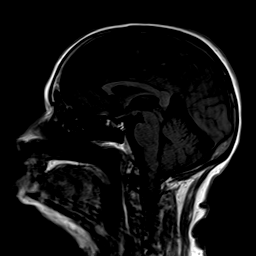
[im 25/25]
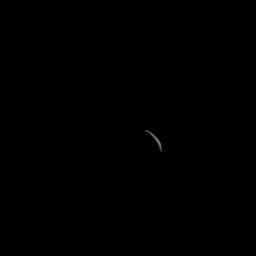

[Series 10: T2 · axial · 5.0mm · 0.72mm/px · z∈[-78,+55]mm · 2 of 20 slices shown (1 of 2)]
[im 1/20]
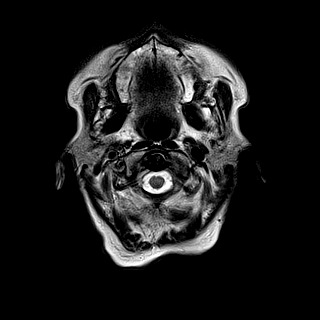
[im 20/20]
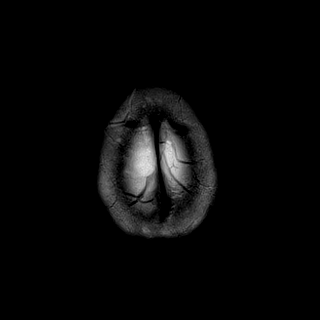

[Series 11: ax hemo · axial · 5.0mm · 0.86mm/px · z∈[-81,+62]mm · 3 of 25 slices shown]
[im 1/25]
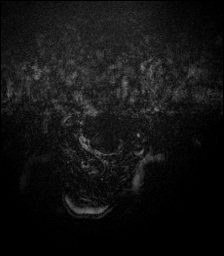
[im 13/25]
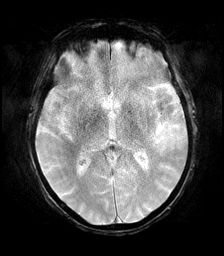
[im 25/25]
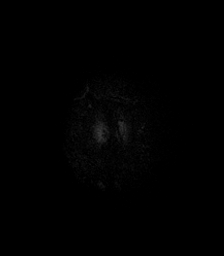

[Series 12: FLAIR · axial · 4.0mm · 0.43mm/px · z∈[-71,+52]mm · 4 of 32 slices shown]
[im 1/32]
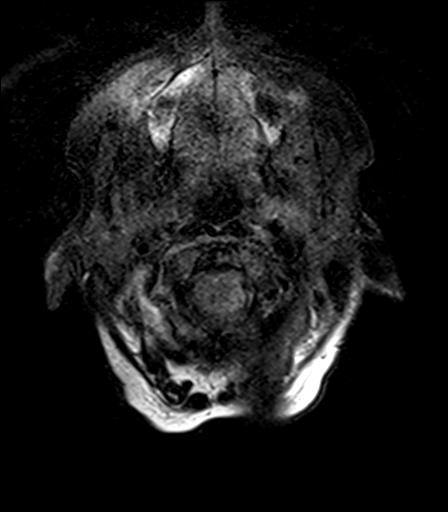
[im 11/32]
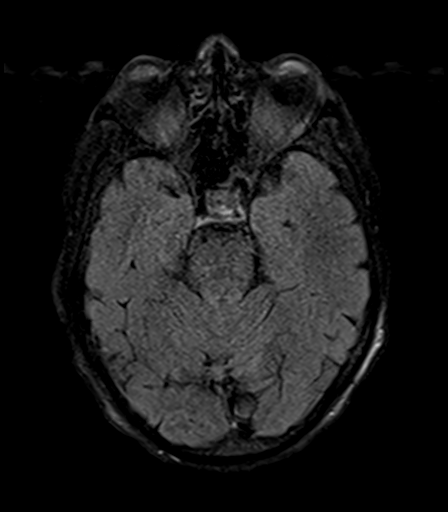
[im 21/32]
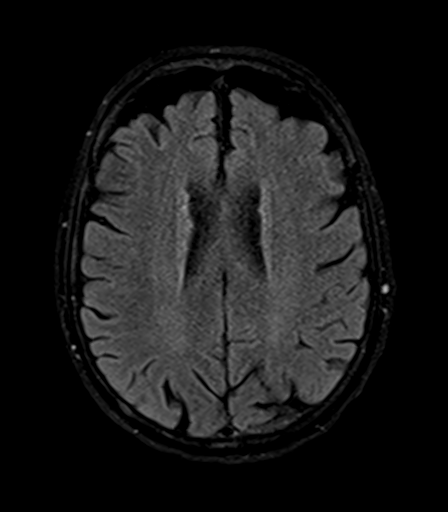
[im 32/32]
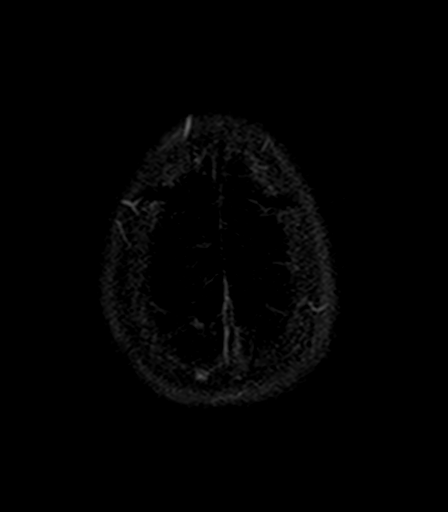

[Series 14: T2 · coronal · 5.0mm · 0.72mm/px · 3 of 28 slices shown (2 of 2)]
[im 1/28]
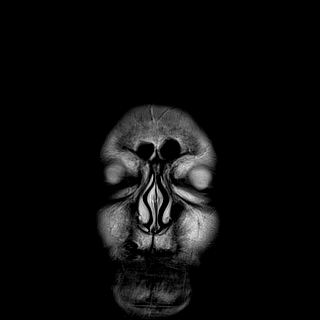
[im 14/28]
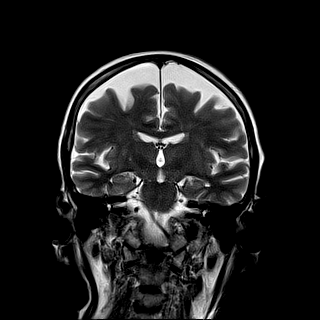
[im 28/28]
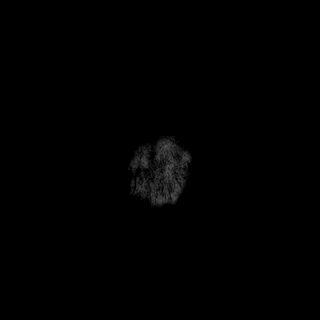

[41 of 48 positions shown; findings below may reference images not displayed]

FINDINGS: Brain: There is no evidence of an acute infarct, intracranial
hemorrhage, mass, midline shift, or extra-axial fluid collection.
Prominent extra-axial CSF spaces over both frontal convexities are
attributed to frontal lobe predominant cerebral atrophy rather than
hygromas. The ventricles are normal in size. T2 hyperintensities in
the pons are nonspecific but likely reflect minimal chronic small
vessel ischemic disease. No significant chronic small vessel disease
is evident in the cerebral white matter.

Vascular: Major intracranial vascular flow voids are preserved.

Skull and upper cervical spine: Unremarkable bone marrow signal.

Sinuses/Orbits: Bilateral cataract extraction. Paranasal sinuses and
mastoid air cells are clear.

Other: None.
IMPRESSION: 1. No acute intracranial abnormality.
2. Minimal chronic small vessel ischemic disease.
3. Front lobe predominant cerebral atrophy.

## 2022-04-09 ENCOUNTER — Other Ambulatory Visit: Payer: Self-pay

## 2022-04-09 MED ORDER — SERTRALINE HCL 25 MG PO TABS: 25.0000 mg | ORAL_TABLET | Freq: Every day | ORAL | 5 refills | Status: DC

## 2022-05-23 ENCOUNTER — Ambulatory Visit: Payer: Medicare Other | Admitting: Adult Health

## 2022-07-02 ENCOUNTER — Other Ambulatory Visit: Payer: Self-pay

## 2022-07-02 DIAGNOSIS — F0280 Dementia in other diseases classified elsewhere without behavioral disturbance: Secondary | ICD-10-CM

## 2022-07-02 MED ORDER — DONEPEZIL HCL 10 MG PO TABS: 10.0000 mg | ORAL_TABLET | Freq: Every day | ORAL | 2 refills | Status: DC

## 2022-07-30 ENCOUNTER — Ambulatory Visit (INDEPENDENT_AMBULATORY_CARE_PROVIDER_SITE_OTHER): Payer: Medicare Other | Admitting: Neurology

## 2022-07-30 ENCOUNTER — Encounter: Payer: Self-pay | Admitting: Neurology

## 2022-07-30 VITALS — BP 162/65 | HR 63 | Ht 59.0 in | Wt 114.0 lb

## 2022-07-30 DIAGNOSIS — G301 Alzheimer's disease with late onset: Secondary | ICD-10-CM | POA: Diagnosis not present

## 2022-07-30 DIAGNOSIS — F02A11 Dementia in other diseases classified elsewhere, mild, with agitation: Secondary | ICD-10-CM | POA: Diagnosis not present

## 2022-07-30 DIAGNOSIS — F03911 Unspecified dementia, unspecified severity, with agitation: Secondary | ICD-10-CM

## 2022-07-30 MED ORDER — VALPROIC ACID 250 MG PO CAPS: 250.0000 mg | ORAL_CAPSULE | Freq: Two times a day (BID) | ORAL | 3 refills | Status: DC

## 2022-07-30 NOTE — Patient Instructions (Signed)
I had a long discussion with the patient and her daughter regarding her mild dementia which appears to be stable but that agitation is being problematic hence I recommend adding Depakote 250 mg twice daily and continue Zoloft at bedtime.  Continue Aricept at the current dose of 10 mg twice daily Eliquis for stroke prevention and maintain aggressive risk factor moderate patient with strict control of hypertension to follow-up in the future 3 months with Shanda Bumps nurse practitioner or call earlier if necessary

## 2022-07-30 NOTE — Progress Notes (Signed)
[name] [address] [place] [place] [address] [phone]       OFFICE FOLLOW UP VISIT NOTE  Ms. Jobyna Lindsay Scott Date of Birth:  [birth year] Medical Record Number:  [id number]   Referring MD:  Lorin Glass  Reason for visit: Alzheimer's dementia   Chief Complaint  Patient presents with   Follow-up    Patient in room #2 with her daughter. Patient states she well and stable but she has some bruise on her left chin. Patient states she hasn't been sleeping well.      HPI: Update 6/12/24/2022 : She returns for follow-up after last visit 8 months ago.  She is accompanied by her daughter.  Patient continues to live with her daughter.  Continues to have mild dementia and cognitive impairment but of late has been getting more confused and agitated at night.  He can mostly be redirected but at times is quite frustrated.  He is also been having increased hallucinations since his people in the room.  He remains on Aricept 10 mg daily which is tolerating well without side effects.  He can be left alone for few hours but not prolonged.  He has not had any violent behavior or exhibited any unsafe behavior.  He is able to ambulate quite well with good balance and has had no falls or injuries.  He remains on Eliquis 2.5 mg twice daily and does bruise easily but has not had any major bleeding episodes.  On Mini-Mental status testing today she scored 20/30 which is stable from last visit.  Patient is on Zoloft 25 mg at night.  She has not tried medications like Depakote or Risperdal for agitation t yet  update 11/15/2021 JM: Patient returns for 78-month dementia follow-up accompanied by her daughter.  Reports she has been doing well overall since prior visit.  Daughter believes memory has been improving and has been doing more things independently.  She has remained on Aricept, trazodone and sertraline. Agaitation and being fidgety has improved since initiating sertraline.  Continues  to sleep well most nights, can have some nights she may only get a few hours of sleep before she will wake up but eventually will fall back to sleep.  She does mention frontal head pressure that is necessarily painful but more annoying, can be worse when it rains, does have congestion and runny nose. Takes Zyrtec, recently increased to 1 tablet daily, previously taking half a tab. Tries use of Flonase but felt this made rhinorrhea worse.  She is being followed by her PCP for this concern.  No further concerns at this time.    History provided for reference purposes only Update 05/15/2021 JM: Patient returns for 17-month dementia follow-up accompanied by her daughter.  Reports cognition stable since prior visit.  MMSE today 24/30 (prior 25/30).  Daughter reports she has been more agitated which patient also agrees with. She is frustrated due to her current health condition with decline in hearing and vision, chronic pain, different medications, etc. she does have underlying history of anxiety and depression and has been on Lexapro before but per daughter, after dosage increase, she started to experience worsening dizziness, slurred speech and word finding difficulty which prompted the ED evaluation on 9/11 but per ED note, unclear if symptoms related to dosage increase but either way this mediation was stopped. Per pt and daughter, they spoke to PCP yesterday about likely underlying depression/anxiety but was told to "talk to your dementia doctor about it". She she  has remained on Aricept, denies side effects.  Remains on trazodone 100 mg nightly, denies side effects, does endorse some improvement of sleep but can still have some difficulty. She does still have a caregiver during the day.  Maintains ADLs independently.  Needs assistance for IADLs.  No further concerns at this time.  Update 11/14/2020 JM: Ms. Leifheit returns for 6 month follow up accompanied by her daughter.  Cognition overall stable without  worsening.  Continues to have difficulty with sleeping - she will sleep good for 3-4 hours but then will not be able to go back to sleep. Also having increased anxiety and agitation.  She continues live with her daughter who is her main caregiver but does have a Comptroller during the day.  She is able to maintain ADLs independently.  Continues on trazodone 75mg  daily and Aricept 10mg  daily.  No new concerns at this time  Update 05/11/2020 JM: Ms. Argetsinger returns for follow-up after prior visit approximately 4 months ago.  She is accompanied by her daughter [name]  Per daughter, she has been doing very well since prior visit and believes she has even been improving especially since starting Aricept.  She is independent with ADLs, ambulates without assistive device and denies any recent falls.  She does have underlying anxiety and insomnia but no other behavioral concerns.  On trazodone 50 mg nightly with some improvement of her sleep -currently sleeping approximately 4 hours at a time where previously would only sleep for 1 to 2 hours.  Trazodone has also helped with anxiety. MMSE today 24/30 (prior 20/30)  Other concerns: Continued weight loss approximately 10 pounds since prior visit as she was limited in what she can eat as she was unable to use her dentures.  She is currently working with dentist to get new dentures and is hopeful to have these within the next 1 to 2 weeks.  Since prior visit, she was seen at [place], [place] for bleeding colonic polyps - removed 2 during admission - since then, no further bleeding issues  Seen rheumatology Monday for further evaluation of arthritis.  She had limited use of her hands with increased swelling and severe pain limiting ADLs and daily functioning. Was started on meloxicam and prednisone for total of 5 days - daughter is not sure if they are supposed to discontinue after that time or if they were supposed to follow-up.  Apparently, patient made a comment regarding  use of meloxicam and being on Eliquis but per patient and daughter, he did not express any concerns.  She has not noticed any issues with bleeding or increased bruising  Update 01/18/2020 JM: She returns for follow-up after last visit 2 months ago.  She is accompanied by her daughter.  Patient had trouble tolerating Namenda and developed increased hallucinations and felt like she was drunk.  She states she also broke out into sores.  She called the office and we advised her to taper and stop it.  She is started feeling better immediately after that.  Patient was then started on Aricept and she is currently taking 5 mg daily since 12/31/2019 and she states states she is already feeling better.  Her bad dreams are gone and hallucinations have also improved.  She is tolerating it well without upset stomach nausea or diarrhea.  She is however been progressively losing weight for the last 6 months or so.  She however denies lack of energy or tiredness.  She underwent EEG on 12/06/2019 which showed mild generalized  slowing without epileptiform activity.  Lab work on 11/09/2019 showed normal vitamin B12, TSH and RPR.  MRI scan of the brain on 11/01/2019 showed no acute abnormalities.  Patient has no new complaints.   Initial visit 11/09/2019 Dr. Pearlean Brownie: Ms. Lowing is a pleasant 88 year old Caucasian lady seen today for initial office consultation visit for aphasia.  She is accompanied by her daughter.  History is obtained from them, review of electronic medical records and I personally reviewed pertinent available imaging films in PACS.  She has past medical history of nonrheumatic aortic stenosis, s/p TAVR, paroxysmal A. fib on chronic anticoagulation with Eliquis, hypertension, coronary artery disease.  She presented to [facility] on 10/30/2019 with a 10-day history of worsening speech and difficulty articulating and dizziness.  Lab work was significant only for low potassium of 2.9 and CT scan of the head showed  no acute abnormality and CT angiogram showed no significant large vessel intracranial extracranial stenosis.  MRI scan of the brain was also obtained which showed no acute abnormalities.  LDL cholesterol 94 mg percent hemoglobin A1c was 5.5.  B12 and TSH were normal.  Echo was not done.  Patient had been started on Lexapro which was discontinued in the hospital and her speech difficulties improved.  However the daughter feels that she has noticed significant cognitive decline and memory difficulties since then.  On inquiry she states feels that this has been going on but has clearly worsened in the last 1 month.  Patient requires information to be given multiple times since she still forgets.  She is required more help with activities of daily living recently.  She does also occasionally get dizzy and feels avoid a straight into the right.  She is also seeing shadows in her room particularly at nighttime.  On Mini-Mental status exam today she scored 20/30 in the office and then significant problems with visual-spatial skills, animal naming and drawing a clock.  She is not had any work-up for reversible causes of memory loss or treatment for Alzheimer's.  There is no family stable tremors.  Patient has no prior history of known stroke, TIA, seizures, significant head injury with loss of consciousness.  She has not exhibited any unsafe behavior.  She denies any delusions.  She is mostly calm and well behaved but does get irritable and agitated at times but there is no violent behavior.  She is not demonstrated tendency to wander off.  She does not drive.    ROS:   14 system review of systems is positive for those listed in HPI and all other systems negative    PMH:  Past Medical History:  Diagnosis Date   Atrial fibrillation (HCC)    Coronary artery disease    Hypertension    Nonrheumatic aortic (valve) stenosis     Social History:  Social History   Socioeconomic History   Marital status: Widowed     Spouse name: Not on file   Number of children: Not on file   Years of education: Not on file   Highest education level: Not on file  Occupational History   Not on file  Tobacco Use   Smoking status: Former    Types: Cigarettes    Quit date: 2003    Years since quitting: 21.4   Smokeless tobacco: Never  Vaping Use   Vaping Use: Never used  Substance and Sexual Activity   Alcohol use: Not Currently   Drug use: Never   Sexual activity: Not Currently  Other Topics Concern   Not on file  Social History Narrative   Lives with daughter [name]   Right Handed   Drinks no caffeine   Social Determinants of Health   Financial Resource Strain: Not on file  Food Insecurity: Not on file  Transportation Needs: Not on file  Physical Activity: Not on file  Stress: Not on file  Social Connections: Not on file  Intimate Partner Violence: Not on file    Medications:   Current Outpatient Medications on File Prior to Visit  Medication Sig Dispense Refill   apixaban (ELIQUIS) 2.5 MG TABS tablet Take by mouth 2 (two) times daily.     donepezil (ARICEPT) 10 MG tablet Take 1 tablet (10 mg total) by mouth at bedtime. 90 tablet 2   furosemide (LASIX) 20 MG tablet Take 1 tablet (20 mg total) by mouth daily as needed. (Patient taking differently: Take 20 mg by mouth daily as needed. 1 tab mon, wed, fri. 1/2 other days) 30 tablet    meloxicam (MOBIC) 7.5 MG tablet as needed.      metoprolol tartrate (LOPRESSOR) 50 MG tablet Take 50 mg by mouth 2 (two) times daily.     omeprazole (PRILOSEC) 20 MG capsule Take 20 mg by mouth daily before breakfast.     potassium chloride (KLOR-CON) 10 MEQ tablet Take 10 mEq by mouth 2 (two) times daily.     sertraline (ZOLOFT) 25 MG tablet Take 1 tablet (25 mg total) by mouth at bedtime. 30 tablet 5   traZODone (DESYREL) 100 MG tablet Take 1 tablet (100 mg total) by mouth at bedtime. 90 tablet 2   Vitamin D, Ergocalciferol, (DRISDOL) 1.25 MG (50000 UNIT) CAPS  capsule Take 50,000 Units by mouth every Sunday.     folic acid (FOLVITE) 1 MG tablet Take 1 mg by mouth daily. (Patient not taking: Reported on 07/30/2022)     No current facility-administered medications on file prior to visit.    Allergies:   Allergies  Allergen Reactions   Betadine [Povidone Iodine] Other (See Comments)   Latex Other (See Comments)    unkn   Penicillins Swelling   Sulfa Antibiotics Other (See Comments)    unkn    Physical Exam Today's Vitals   07/30/22 1452  BP: (!) 162/65  Pulse: 63  Weight: 114 lb (51.7 kg)  Height: 4\' 11"  (1.499 m)   Body mass index is 23.03 kg/m.  General: Frail very pleasant elderly Caucasian lady seated, in no evident distress Head: head normocephalic and atraumatic.   Neck: supple with no carotid or supraclavicular bruits Cardiovascular: irregular rate and rhythm, no murmurs Musculoskeletal: Multiple bilateral hand arthritic nodules Skin:  no rash/petichiae Vascular:  Normal pulses all extremities  Neurologic Exam Mental Status: Awake and fully alert. Recent memory diminished and remote memory intact. Attention span, concentration and fund of knowledge largely appropriate during visit. Mood and affect appropriate.  Hesitant speech but no true aphasia or dysarthria.     07/30/2022    2:57 PM 11/15/2021    1:35 PM 05/15/2021   12:40 PM  MMSE - Mini Mental State Exam  Orientation to time 5 5 3   Orientation to Place 5 5 5   Registration 3 3 3   Attention/ Calculation 1 1 3   Recall 2 2 3   Language- name 2 objects 2 2 2   Language- repeat 1 1 0  Language- follow 3 step command 3 3 3   Language- read & follow direction 1 1 1   Write a  sentence 1 1 1   Copy design 0 0 0  Total score 24 24 24    Cranial Nerves: Pupils equal, briskly reactive to light. Extraocular movements full without nystagmus. Visual fields full to confrontation. Hearing diminished bilaterally.  Facial sensation intact. Face, tongue, palate moves normally and  symmetrically.  Motor: Normal bulk and tone. Normal strength in all tested extremity muscles.  No evidence of action or resting tremors today.  No cogwheel rigidity.  No bradykinesia. Sensory.: intact to touch , pinprick , position and vibratory sensation.  Coordination: Rapid alternating movements normal in all extremities. Finger-to-nose and heel-to-shin performed accurately bilaterally. Gait and Station: Arises from chair without difficulty. Stance is slightly stooped. Gait demonstrates slightly decreased stride length and step height without use of assistive device.  Tandem walk and heel toe not attempted. Reflexes: 1+ and symmetric. Toes downgoing.      ASSESSMENT/PLAN: Abela Halbritter is a very pleasant 88 year old Caucasian lady with progressive memory, cognitive and speech issues evaluated by Dr. Pearlean Brownie 11/09/2019 likely from underlying mild Alzheimer's dementia   PLAN :I had a long discussion with the patient and her daughter regarding her mild dementia which appears to be stable but that agitation is being problematic hence I recommend adding Depakote 250 mg twice daily and continue Zoloft at bedtime.  Continue Aricept at the current dose of 10 mg twice daily Eliquis for stroke prevention and maintain aggressive risk factor moderate patient with strict control of hypertension to follow-up in the future 3 months with Shanda Bumps nurse practitioner or call earlier if necessary Greater than 50% time during this 35-minute visit spent on counseling and coordination of care about exam is dementia and agitation   Delia Heady, MD  [facility] [address] [place], [place] [address]  Phone [phone] Fax [phone] Note: This document was prepared with digital dictation and possible smart phrase technology. Any transcriptional errors that result from this process are unintentional.

## 2022-08-13 ENCOUNTER — Other Ambulatory Visit: Payer: Self-pay

## 2022-08-13 ENCOUNTER — Telehealth: Payer: Self-pay | Admitting: Neurology

## 2022-08-13 MED ORDER — TRAZODONE HCL 100 MG PO TABS: 100.0000 mg | ORAL_TABLET | Freq: Every day | ORAL | 3 refills | Status: DC

## 2022-08-13 MED ORDER — VALPROIC ACID 250 MG PO CAPS: 250.0000 mg | ORAL_CAPSULE | Freq: Two times a day (BID) | ORAL | 3 refills | Status: DC

## 2022-08-13 NOTE — Telephone Encounter (Signed)
Refill has been sent for the pt 

## 2022-08-13 NOTE — Telephone Encounter (Signed)
Pt is needing a refill on her traZODone (DESYREL) 100 MG tablet sent to the [facility] in [place]

## 2022-11-04 ENCOUNTER — Other Ambulatory Visit: Payer: Self-pay

## 2022-11-04 MED ORDER — SERTRALINE HCL 25 MG PO TABS: 25.0000 mg | ORAL_TABLET | Freq: Every day | ORAL | 5 refills | Status: DC

## 2022-11-04 NOTE — Progress Notes (Signed)
Patient to continue sertraline 25 mg at night per last office visit note.

## 2022-12-03 ENCOUNTER — Ambulatory Visit: Payer: Medicare Other | Admitting: Adult Health

## 2022-12-25 ENCOUNTER — Encounter: Payer: Self-pay | Admitting: Adult Health

## 2022-12-25 ENCOUNTER — Ambulatory Visit (INDEPENDENT_AMBULATORY_CARE_PROVIDER_SITE_OTHER): Payer: Medicare Other | Admitting: Adult Health

## 2022-12-25 VITALS — BP 154/74 | HR 86 | Ht 59.0 in | Wt 106.0 lb

## 2022-12-25 DIAGNOSIS — F03B18 Unspecified dementia, moderate, with other behavioral disturbance: Secondary | ICD-10-CM

## 2022-12-25 NOTE — Patient Instructions (Addendum)
Your Plan:  Continue current medications with Aricept, sertraline, and trazodone   Try melatonin at sunset to see if this helps with sleep   Continue to monitor dizziness especially when dizziness occurs, ensure you make position changes slowly as this can help. If you find dizziness occurs with quick head movements or laying down in bed, can consider doing vestibular therapy for possibly benign positional vertigo     Follow up in 6 months or call earlier if needed     Thank you for coming to see [place] at [facility]. I hope we have been able to provide you high quality care today.  You may receive a patient satisfaction survey over the next few weeks. We would appreciate your feedback and comments so that we may continue to improve ourselves and the health of our patients.    Lewy Body Dementia Lewy body dementia, or LBD, is a condition that affects the way your brain works. This type of dementia happens when proteins called Lewy bodies build up in the brain. It causes problems with memory, thinking, movement, and behavior. This condition gets worse over time. There's no cure, but some treatments can help with the symptoms. What are the causes? LBD happens when Lewy bodies build up in parts of the brain that control memory, thinking, and movement. What causes these proteins to build up isn't known. What increases the risk? Having a family history of LBD or Parkinson's disease. Being 24 years old or older. Being female. What are the signs or symptoms? Symptoms may include: Hallucinating. This means you see, hear, taste, smell, or feel things that are not real. Sleep problems, such as acting out dreams while you're asleep. Changes in memory, attention, and ability to focus that come and go. Symptoms of dementia, such as having trouble with: Memory. Paying attention. Planning and organizing. Making good choices. Speaking. Behavior. People with LBD may also have  symptoms of Parkinson's disease. These may include: Shaking movements or tremors that you can't control. This usually starts in a hand or foot when you're resting. Stooped posture. Slow movement. Stiff muscles. Loss of balance when standing. How is this diagnosed? LBD is diagnosed by an expert called a neurologist. It may be diagnosed based on: Your symptoms and medical history. Your health care provider will talk with you and your family, friends, or caregivers about your history and symptoms. A physical exam. Tests. These may include: Lab tests, such as blood or pee (urine) tests. Imaging tests. You may have a CT scan, a PET scan, or an MRI. A lumbar puncture. For this test, a sample of the fluid around the brain and spinal cord is taken and tested. A skin biopsy. This is when a skin sample is taken. The sample is looked at under a microscope to check for the protein. Tests to check your thinking and memory. How is this treated? There's no cure for LBD. Treatment helps manage your symptoms and might include: Medicines to help with hallucinations, sleep, and behavior problems. Therapy to help with talking or movement. This might be speech, occupational, and physical therapy. Your provider can help you find support groups and other people who can help with your care. Follow these instructions at home: Medicines Take over-the-counter and prescription medicines only as told by your provider. Use a pill organizer or pill reminder to help keep track of your medicines. Do not take medicines that can affect your thinking. This includes pain medicines and some medicines to help with  sleep. Always check with your provider before taking any new medicines. Lifestyle Make healthy choices: Exercise and be active as told by your provider. Do not use any products that contain nicotine or tobacco. These products include cigarettes, chewing tobacco, and vaping devices, such as e-cigarettes. If you need  help quitting, ask your provider. When you feel a lot of stress, do something that helps you relax. Try things like mindfulness, yoga, or deep breathing. Spend time with other people. Talk with family, friends, and neighbors often. Make sure you sleep well. These tips can help: Try not to take naps during the day. Keep your bedroom dark and cool. Do not exercise in the few hours before you go to bed. Do not have foods or drinks with caffeine in them at night. Eating and drinking Do not drink alcohol. Drink enough fluid to keep your pee (urine) pale yellow. Eat a healthy diet. Safety  Talk with your provider to decide: What things you need help with. How to stay safe. If you have trouble walking, use a cane or walker as told by your provider. Make sure your home is safe. Get rid of things that you could trip over, such as throw rugs or clutter. Put grab bars and railings in your home to keep you from falling. Talk with your provider about whether it's safe for you to drive. If told, wear an alert bracelet that tracks where you are and shows that you're a person with memory loss. Make sure you wear it at all times. General instructions  Work with your family to make big legal or health decisions. This may include things like advance directives, medical power of attorney, or a living will. Join a support group for people with LBD. Where to find more information [employer] of Neurological Disorders and Stroke: BasicFM.no Lewy Body Dementia Association: lbda.org Contact a health care provider if: You have confusion that's new or getting worse. You have trouble sleeping that's new or getting worse. You get sleepy during the day more often. You have problems with choking or swallowing. Get help right away if: You feel depressed or very sad or feel like you may hurt yourself. Your family members are worried about your safety. Get help right away if you feel like you may hurt  yourself or others, or have thoughts about taking your own life. Go to your nearest emergency room or: Call 911. Call the National Suicide Prevention Lifeline at 306-115-1260 or 988. This is open 24 hours a day. Text the Crisis Text Line at 463 078 1459. This information is not intended to replace advice given to you by your health care provider. Make sure you discuss any questions you have with your health care provider. Document Revised: 04/22/2022 Document Reviewed: 04/22/2022 Elsevier Patient Education  2024 [employer].

## 2022-12-25 NOTE — Progress Notes (Signed)
[name] [address] [place]. [place] [address] [phone]       OFFICE FOLLOW UP VISIT NOTE  [name] Date of Birth:  1935-01-28 Medical Record Number:  [id number]   Referring MD:  Lindsay Scott  Reason for visit: Alzheimer's dementia   Chief Complaint  Patient presents with   Follow-up    Pt with daughter. Rm 8. Overall feels things are stable       HPI:  Update 12/25/2022 JM: Patient returns for follow-up visit accompanied by her daughter.  At prior visit with Dr. Pearlean Brownie, Depakote 250mg  BID initiated in addition to sertraline and trazodone for agitation and continued on donepezil.  Patient has been doing well since prior visit and overall stable. Did not start Depakote due to concern of potential side effects. Continues to have visual hallucinations and night terrors at times but not nightly, does not act out dreams. No other significant behavioral concerns.  Remains on donepezil, sertraline and trazodone. Lives with daughter, able to maintain majority ADLs independently. Sister in law stays with her during the day. At times can feel off balance but no recent falls, does not use AD. C/o intermittent dizziness with room spinning sensation, lasts only short duration, unsure if triggered by quick head movements or quick position changes. Daughter notes she stays very active during the day, will make position changes very quickly such as bending over to pick something up or turning quickly. Does drink plenty of water during the day adequate nutrition.  Does not routinely monitor blood pressure at home, slightly elevated today but asymptomatic.  Routinely follows with cardiology, discontinue digoxin about 1 month ago, remains on metoprolol and Eliquis.     History provided for reference purposes only Update 6/12/24/2022 Dr. Pearlean Brownie: She returns for follow-up after last visit 8 months ago.  She is accompanied by her daughter.  Patient continues to live  with her daughter.  Continues to have mild dementia and cognitive impairment but of late has been getting more confused and agitated at night.  He can mostly be redirected but at times is quite frustrated.  He is also been having increased hallucinations since his people in the room.  He remains on Aricept 10 mg daily which is tolerating well without side effects.  He can be left alone for few hours but not prolonged.  He has not had any violent behavior or exhibited any unsafe behavior.  He is able to ambulate quite well with good balance and has had no falls or injuries.  He remains on Eliquis 2.5 mg twice daily and does bruise easily but has not had any major bleeding episodes.  On Mini-Mental status testing today she scored 20/30 which is stable from last visit.  Patient is on Zoloft 25 mg at night.  She has not tried medications like Depakote or Risperdal for agitation t yet   Update 11/15/2021 JM: Patient returns for 34-month dementia follow-up accompanied by her daughter.  Reports she has been doing well overall since prior visit.  Daughter believes memory has been improving and has been doing more things independently.  She has remained on Aricept, trazodone and sertraline. Agaitation and being fidgety has improved since initiating sertraline.  Continues to sleep well most nights, can have some nights she may only get a few hours of sleep before she will wake up but eventually will fall back to sleep.  She does mention frontal head pressure that is necessarily painful but more annoying, can be  worse when it rains, does have congestion and runny nose. Takes Zyrtec, recently increased to 1 tablet daily, previously taking half a tab. Tries use of Flonase but felt this made rhinorrhea worse.  She is being followed by her PCP for this concern.  No further concerns at this time.  Update 05/15/2021 JM: Patient returns for 54-month dementia follow-up accompanied by her daughter.  Reports cognition stable since prior  visit.  MMSE today 24/30 (prior 25/30).  Daughter reports she has been more agitated which patient also agrees with. She is frustrated due to her current health condition with decline in hearing and vision, chronic pain, different medications, etc. she does have underlying history of anxiety and depression and has been on Lexapro before but per daughter, after dosage increase, she started to experience worsening dizziness, slurred speech and word finding difficulty which prompted the ED evaluation on 9/11 but per ED note, unclear if symptoms related to dosage increase but either way this mediation was stopped. Per pt and daughter, they spoke to PCP yesterday about likely underlying depression/anxiety but was told to "talk to your dementia doctor about it". She she has remained on Aricept, denies side effects.  Remains on trazodone 100 mg nightly, denies side effects, does endorse some improvement of sleep but can still have some difficulty. She does still have a caregiver during the day.  Maintains ADLs independently.  Needs assistance for IADLs.  No further concerns at this time.  Update 11/14/2020 JM: [name] returns for 6 month follow up accompanied by her daughter.  Cognition overall stable without worsening.  Continues to have difficulty with sleeping - she will sleep good for 3-4 hours but then will not be able to go back to sleep. Also having increased anxiety and agitation.  She continues live with her daughter who is her main caregiver but does have a Comptroller during the day.  She is able to maintain ADLs independently.  Continues on trazodone 75mg  daily and Aricept 10mg  daily.  No new concerns at this time  Update 05/11/2020 JM: [name] returns for follow-up after prior visit approximately 4 months ago.  She is accompanied by her daughter Lindsay Scott  Per daughter, she has been doing very well since prior visit and believes she has even been improving especially since starting Aricept.  She is  independent with ADLs, ambulates without assistive device and denies any recent falls.  She does have underlying anxiety and insomnia but no other behavioral concerns.  On trazodone 50 mg nightly with some improvement of her sleep -currently sleeping approximately 4 hours at a time where previously would only sleep for 1 to 2 hours.  Trazodone has also helped with anxiety. MMSE today 24/30 (prior 20/30)  Other concerns: Continued weight loss approximately 10 pounds since prior visit as she was limited in what she can eat as she was unable to use her dentures.  She is currently working with dentist to get new dentures and is hopeful to have these within the next 1 to 2 weeks.  Since prior visit, she was seen at [place], [place] for bleeding colonic polyps - removed 2 during admission - since then, no further bleeding issues  Seen rheumatology Monday for further evaluation of arthritis.  She had limited use of her hands with increased swelling and severe pain limiting ADLs and daily functioning. Was started on meloxicam and prednisone for total of 5 days - daughter is not sure if they are supposed to discontinue after that time or  if they were supposed to follow-up.  Apparently, patient made a comment regarding use of meloxicam and being on Eliquis but per patient and daughter, he did not express any concerns.  She has not noticed any issues with bleeding or increased bruising  Update 01/18/2020 JM: She returns for follow-up after last visit 2 months ago.  She is accompanied by her daughter.  Patient had trouble tolerating Namenda and developed increased hallucinations and felt like she was drunk.  She states she also broke out into sores.  She called the office and we advised her to taper and stop it.  She is started feeling better immediately after that.  Patient was then started on Aricept and she is currently taking 5 mg daily since 12/31/2019 and she states states she is already feeling better.  Her bad  dreams are gone and hallucinations have also improved.  She is tolerating it well without upset stomach nausea or diarrhea.  She is however been progressively losing weight for the last 6 months or so.  She however denies lack of energy or tiredness.  She underwent EEG on 12/06/2019 which showed mild generalized slowing without epileptiform activity.  Lab work on 11/09/2019 showed normal vitamin B12, TSH and RPR.  MRI scan of the brain on 11/01/2019 showed no acute abnormalities.  Patient has no new complaints.   Initial visit 11/09/2019 Dr. Pearlean Brownie: Ms. [place] is a pleasant 88 year old Caucasian lady seen today for initial office consultation visit for aphasia.  She is accompanied by her daughter.  History is obtained from them, review of electronic medical records and I personally reviewed pertinent available imaging films in PACS.  She has past medical history of nonrheumatic aortic stenosis, s/p TAVR, paroxysmal A. fib on chronic anticoagulation with Eliquis, hypertension, coronary artery disease.  She presented to [facility] on 10/30/2019 with a 10-day history of worsening speech and difficulty articulating and dizziness.  Lab work was significant only for low potassium of 2.9 and CT scan of the head showed no acute abnormality and CT angiogram showed no significant large vessel intracranial extracranial stenosis.  MRI scan of the brain was also obtained which showed no acute abnormalities.  LDL cholesterol 94 mg percent hemoglobin A1c was 5.5.  B12 and TSH were normal.  Echo was not done.  Patient had been started on Lexapro which was discontinued in the hospital and her speech difficulties improved.  However the daughter feels that she has noticed significant cognitive decline and memory difficulties since then.  On inquiry she states feels that this has been going on but has clearly worsened in the last 1 month.  Patient requires information to be given multiple times since she still forgets.  She is  required more help with activities of daily living recently.  She does also occasionally get dizzy and feels avoid a straight into the right.  She is also seeing shadows in her room particularly at nighttime.  On Mini-Mental status exam today she scored 20/30 in the office and then significant problems with visual-spatial skills, animal naming and drawing a clock.  She is not had any work-up for reversible causes of memory loss or treatment for Alzheimer's.  There is no family stable tremors.  Patient has no prior history of known stroke, TIA, seizures, significant head injury with loss of consciousness.  She has not exhibited any unsafe behavior.  She denies any delusions.  She is mostly calm and well behaved but does get irritable and agitated at times but  there is no violent behavior.  She is not demonstrated tendency to wander off.  She does not drive.    ROS:   14 system review of systems is positive for those listed in HPI and all other systems negative    PMH:  Past Medical History:  Diagnosis Date   Atrial fibrillation (HCC)    Coronary artery disease    Hypertension    Nonrheumatic aortic (valve) stenosis     Social History:  Social History   Socioeconomic History   Marital status: Widowed    Spouse name: Not on file   Number of children: Not on file   Years of education: Not on file   Highest education level: Not on file  Occupational History   Not on file  Tobacco Use   Smoking status: Former    Current packs/day: 0.00    Types: Cigarettes    Quit date: 2003    Years since quitting: 21.8   Smokeless tobacco: Never  Vaping Use   Vaping status: Never Used  Substance and Sexual Activity   Alcohol use: Not Currently   Drug use: Never   Sexual activity: Not Currently  Other Topics Concern   Not on file  Social History Narrative   Lives with daughter Lindsay Scott   Right Handed   Drinks no caffeine   Social Determinants of Health   Financial Resource Strain: Not on  file  Food Insecurity: Not on file  Transportation Needs: Not on file  Physical Activity: Not on file  Stress: Not on file  Social Connections: Not on file  Intimate Partner Violence: Not on file    Medications:   Current Outpatient Medications on File Prior to Visit  Medication Sig Dispense Refill   apixaban (ELIQUIS) 2.5 MG TABS tablet Take by mouth 2 (two) times daily.     donepezil (ARICEPT) 10 MG tablet Take 1 tablet (10 mg total) by mouth at bedtime. 90 tablet 2   furosemide (LASIX) 20 MG tablet Take 1 tablet (20 mg total) by mouth daily as needed. (Patient taking differently: Take 20 mg by mouth daily as needed. 1 tab mon, wed, fri. 1/2 other days) 30 tablet    meloxicam (MOBIC) 7.5 MG tablet as needed.      omeprazole (PRILOSEC) 20 MG capsule Take 20 mg by mouth daily before breakfast.     potassium chloride (KLOR-CON) 10 MEQ tablet Take 10 mEq by mouth 2 (two) times daily.     sertraline (ZOLOFT) 25 MG tablet Take 1 tablet (25 mg total) by mouth at bedtime. 30 tablet 5   traZODone (DESYREL) 100 MG tablet Take 1 tablet (100 mg total) by mouth at bedtime. 90 tablet 3   No current facility-administered medications on file prior to visit.    Allergies:   Allergies  Allergen Reactions   Betadine [Povidone Iodine] Other (See Comments)   Latex Other (See Comments)    unkn   Penicillins Swelling   Sulfa Antibiotics Other (See Comments)    unkn    Physical Exam Today's Vitals   12/25/22 1415  BP: (!) 154/74  Pulse: 86  Weight: 106 lb (48.1 kg)  Height: 4\' 11"  (1.499 m)   Body mass index is 21.41 kg/m.  General: Frail very pleasant elderly Caucasian lady seated, in no evident distress  Neurologic Exam Mental Status: Awake and fully alert. Recent memory diminished and remote memory intact. Attention span, concentration and fund of knowledge largely appropriate during visit. Mood and affect appropriate.  Hesitant speech but no true aphasia or dysarthria.     12/25/2022     2:15 PM 07/30/2022    2:57 PM 11/15/2021    1:35 PM  MMSE - Mini Mental State Exam  Orientation to time 4 5 5   Orientation to Place 5 5 5   Registration 3 3 3   Attention/ Calculation 5 1 1   Recall 2 2 2   Language- name 2 objects 2 2 2   Language- repeat 1 1 1   Language- follow 3 step command 2 3 3   Language- read & follow direction 1 1 1   Write a sentence 1 1 1   Copy design 1 0 0  Total score 27 24 24    Cranial Nerves: Pupils equal, briskly reactive to light. Extraocular movements full without nystagmus. Visual fields full to confrontation. Hearing diminished bilaterally.  Facial sensation intact. Face, tongue, palate moves normally and symmetrically.  Motor: Normal bulk and tone. Normal strength in all tested extremity muscles.  No evidence of action or resting tremors today.  No cogwheel rigidity.  No bradykinesia. Sensory.: intact to touch , pinprick , position and vibratory sensation.  Coordination: Rapid alternating movements normal in all extremities. Finger-to-nose and heel-to-shin performed accurately bilaterally. Gait and Station: Arises from chair without difficulty. Stance is slightly stooped. Gait demonstrates slightly decreased stride length and step height with mild unsteadiness especially with turns, decreased right arm swing (although c/o shoulder pain), no pill rolling tremor.  Tandem walk and heel toe not attempted. Reflexes: 1+ and symmetric. Toes downgoing.      ASSESSMENT/PLAN: Gwyn Hieronymus is a very pleasant 88 year old Caucasian lady with progressive memory, cognitive and speech issues evaluated by Dr. Pearlean Brownie 11/09/2019 likely from underlying dementia    Mild to moderate dementia -Cognition stable MMSE today 27/30 (prior 24/30) -question possible Lewy body dementia with visual hallucinations, possible autonomic dysfunction contributing to dizziness, and impaired gait. Will continue to monitor for now. Advised to closely monitor dizziness and try to note triggering  factors/activities. Monitor blood pressure at home.  Discussed making slow position changes. -Continue Aricept 10 mg nightly -can consider use of Exelon if needed but as currently stable, will hold off.  Intolerant to memantine -Continue sertraline 25 mg nightly  -Continue trazodone 100 mg nightly -hesitant to increase further due to already high dose -Discussed trialing melatonin 5 mg nightly to further help with occasional insomnia -currently visual hallucinations not bothersome, will hold off on medication management for now -Dementia panel WNL -MR brain 10/2019 frontal lobe cerebral atrophy with no acute abnormalities -EEG mild slowing but no seizure activity noted -Intolerant to Namenda - reported visual hallucinations, "drunk" and rash    Follow-up in 6 months or call earlier if needed    CC:  Craig Staggers, MD   I spent 40 minutes of face-to-face and non-face-to-face time with patient and daughter.  This included previsit chart review, lab review, study review, order entry, electronic health record documentation, patient and daughter education and discussion regarding above diagnoses and treatment plan and answered all other questions to patient and daughters satisfaction  Ihor Austin, [facility]  [facility] [address] [place], [place] [address]  Phone [phone] Fax [phone] Note: This document was prepared with digital dictation and possible smart phrase technology. Any transcriptional errors that result from this process are unintentional.

## 2023-01-01 ENCOUNTER — Other Ambulatory Visit: Payer: Self-pay | Admitting: Adult Health

## 2023-01-02 ENCOUNTER — Other Ambulatory Visit: Payer: Self-pay | Admitting: Anesthesiology

## 2023-01-02 DIAGNOSIS — F0280 Dementia in other diseases classified elsewhere without behavioral disturbance: Secondary | ICD-10-CM

## 2023-01-02 MED ORDER — DONEPEZIL HCL 10 MG PO TABS: 10.0000 mg | ORAL_TABLET | Freq: Every day | ORAL | 2 refills | Status: DC

## 2023-04-01 ENCOUNTER — Telehealth: Payer: Self-pay | Admitting: Adult Health

## 2023-04-01 DIAGNOSIS — F0280 Dementia in other diseases classified elsewhere without behavioral disturbance: Secondary | ICD-10-CM

## 2023-04-01 MED ORDER — DONEPEZIL HCL 10 MG PO TABS: 10.0000 mg | ORAL_TABLET | Freq: Every day | ORAL | 2 refills | Status: DC

## 2023-04-01 NOTE — Addendum Note (Signed)
Addended by: Judi Cong on: 04/01/2023 11:44 AM   Modules accepted: Orders

## 2023-04-01 NOTE — Telephone Encounter (Signed)
Pt's daughter, Cornelia Copa request refill for donepezil (ARICEPT) 10 MG tablet  send to [facility] [phone]

## 2023-04-01 NOTE — Telephone Encounter (Signed)
Called the daughter back  she has been on the phone trying to speak to someone in the pharmacy for 2 days and can't get a hold of anyone. Advised I would send again indicating the patient is on last pill and needs refill. She was appreciative for the call back.

## 2023-04-01 NOTE — Telephone Encounter (Signed)
Medication was sent to the pharmacy November for 3 month supply with 2 refills. The pt should ave refills on file with the pharmacy

## 2023-05-05 ENCOUNTER — Other Ambulatory Visit: Payer: Self-pay | Admitting: *Deleted

## 2023-05-05 MED ORDER — SERTRALINE HCL 25 MG PO TABS: 25.0000 mg | ORAL_TABLET | Freq: Every day | ORAL | 1 refills | Status: DC

## 2023-05-05 NOTE — Telephone Encounter (Signed)
 Last seen on 12/25/22 Follow up scheduled on 07/08/23

## 2023-05-08 ENCOUNTER — Other Ambulatory Visit: Payer: Self-pay | Admitting: Neurology

## 2023-05-08 NOTE — Telephone Encounter (Signed)
 Last seen on 12/25/22 Follow up scheduled on 07/08/23

## 2023-06-10 ENCOUNTER — Other Ambulatory Visit: Payer: Self-pay

## 2023-06-10 MED ORDER — SERTRALINE HCL 25 MG PO TABS: 25.0000 mg | ORAL_TABLET | Freq: Every day | ORAL | 3 refills | Status: DC

## 2023-07-08 ENCOUNTER — Ambulatory Visit (INDEPENDENT_AMBULATORY_CARE_PROVIDER_SITE_OTHER): Payer: Medicare Other | Admitting: Adult Health

## 2023-07-08 ENCOUNTER — Encounter: Payer: Self-pay | Admitting: Adult Health

## 2023-07-08 VITALS — BP 110/70 | HR 70 | Ht 59.0 in | Wt 107.0 lb

## 2023-07-08 DIAGNOSIS — F03B18 Unspecified dementia, moderate, with other behavioral disturbance: Secondary | ICD-10-CM | POA: Diagnosis not present

## 2023-07-08 DIAGNOSIS — R42 Dizziness and giddiness: Secondary | ICD-10-CM

## 2023-07-08 MED ORDER — DONEPEZIL HCL 10 MG PO TABS: 10.0000 mg | ORAL_TABLET | Freq: Every day | ORAL | 3 refills | Status: DC

## 2023-07-08 MED ORDER — TRAZODONE HCL 100 MG PO TABS: 100.0000 mg | ORAL_TABLET | Freq: Every day | ORAL | 3 refills | Status: DC

## 2023-07-08 MED ORDER — SERTRALINE HCL 50 MG PO TABS: 50.0000 mg | ORAL_TABLET | Freq: Every day | ORAL | 11 refills | Status: DC

## 2023-07-08 NOTE — Patient Instructions (Addendum)
 Your Plan:  Referral placed for neurocognitive evaluation   Continue Aricept  10 mg nightly  Continue sertraline  but will increase to 50mg  nightly to further help with outbursts and agitation   Continue trazodone  nightly to help with sleep   Ensure you are moving slow especially after sitting for prolonged period of time. Use of thigh-high compression stockings can also help with your dizziness symptoms.  Ensure you continue to follow with cardiology  Can contact [facility] dementia family support for assistance: [facility] [address], [place] [address] [phone]     Follow-up in 7 months or call earlier if needed     Thank you for coming to see us  at [facility]. I hope we have been able to provide you high quality care today.  You may receive a patient satisfaction survey over the next few weeks. We would appreciate your feedback and comments so that we may continue to improve ourselves and the health of our patients.

## 2023-07-08 NOTE — Progress Notes (Signed)
 [name] [address] [place]. [place] [address] [phone]       OFFICE FOLLOW UP VISIT NOTE  [name] Date of Birth:  04/25/1934 Medical Record Number:  [id number]    Primary neurologist: Dr. Janett Medin Reason for visit: Alzheimer's dementia   Chief Complaint  Patient presents with   Follow-up    RM 3, Pt w/daughter. Here for f/u. Pt reports having a fall 2/19 did not go to hospital no breaks, no major injuries. Also a fall last Sun 5/18 no injuries. Daughter reports Pt has some dizziness.      HPI:  Update 07/08/2023 JM: Patient returns for 7-month follow-up accompanied by her daughter.  Reports continued gradual decline and increased agitation. She can easily get frustrated if she is told to do something such as using her walker or sitting down when feeling dizzy, she will slam her fists onto a table causing bruising. It can be difficult for her daughter to redirect.  She is also having increased visual hallucinations especially at night which has been causing agitation. Sleeps poorly at night, naps frequently during the day. Remains on donepezil  10 mg nightly, sertraline  25 mg daily and trazodone  100 mg nightly.  Daughter is primary caregiver but still working 6 days per week, do suspect caregiver burnout.  Reports she has 2 more years until she is able to retire.   Reports a fall back in February and over this past weekend but thankfully without hitting her head or significant injury. Does c/o right shoulder pain.  Reports continued intermittent dizziness, PCP recently noted orthostatic hypotension and recommended reduction of metoprolol  dosing to 25 mg twice daily and discontinued furosemide .  She prefers not to use rolling walker despite daughter encouraging this, can quickly increase agitation.      History provided for reference purposes only Update 12/25/2022 JM: Patient returns for follow-up visit accompanied by her daughter.  At prior visit  with Dr. Janett Medin, Depakote 250mg  BID initiated in addition to sertraline  and trazodone  for agitation and continued on donepezil .  Patient has been doing well since prior visit and overall stable. Did not start Depakote due to concern of potential side effects. Continues to have visual hallucinations and night terrors at times but not nightly, does not act out dreams. No other significant behavioral concerns.  Remains on donepezil , sertraline  and trazodone . Lives with daughter, able to maintain majority ADLs independently. Sister in law stays with her during the day. At times can feel off balance but no recent falls, does not use AD. C/o intermittent dizziness with room spinning sensation, lasts only short duration, unsure if triggered by quick head movements or quick position changes. Daughter notes she stays very active during the day, will make position changes very quickly such as bending over to pick something up or turning quickly. Does drink plenty of water during the day adequate nutrition.  Does not routinely monitor blood pressure at home, slightly elevated today but asymptomatic.  Routinely follows with cardiology, discontinue digoxin about 1 month ago, remains on metoprolol  and Eliquis .  Update 6/12/24/2022 Dr. Janett Medin: She returns for follow-up after last visit 8 months ago.  She is accompanied by her daughter.  Patient continues to live with her daughter.  Continues to have mild dementia and cognitive impairment but of late has been getting more confused and agitated at night.  He can mostly be redirected but at times is quite frustrated.  He is also been having increased hallucinations since his people in  the room.  He remains on Aricept  10 mg daily which is tolerating well without side effects.  He can be left alone for few hours but not prolonged.  He has not had any violent behavior or exhibited any unsafe behavior.  He is able to ambulate quite well with good balance and has had no falls or  injuries.  He remains on Eliquis  2.5 mg twice daily and does bruise easily but has not had any major bleeding episodes.  On Mini-Mental status testing today she scored 20/30 which is stable from last visit.  Patient is on Zoloft  25 mg at night.  She has not tried medications like Depakote or Risperdal for agitation t yet   Update 11/15/2021 JM: Patient returns for 62-month dementia follow-up accompanied by her daughter.  Reports she has been doing well overall since prior visit.  Daughter believes memory has been improving and has been doing more things independently.  She has remained on Aricept , trazodone  and sertraline . Agaitation and being fidgety has improved since initiating sertraline .  Continues to sleep well most nights, can have some nights she may only get a few hours of sleep before she will wake up but eventually will fall back to sleep.  She does mention frontal head pressure that is necessarily painful but more annoying, can be worse when it rains, does have congestion and runny nose. Takes Zyrtec, recently increased to 1 tablet daily, previously taking half a tab. Tries use of Flonase but felt this made rhinorrhea worse.  She is being followed by her PCP for this concern.  No further concerns at this time.  Update 05/15/2021 JM: Patient returns for 30-month dementia follow-up accompanied by her daughter.  Reports cognition stable since prior visit.  MMSE today 24/30 (prior 25/30).  Daughter reports she has been more agitated which patient also agrees with. She is frustrated due to her current health condition with decline in hearing and vision, chronic pain, different medications, etc. she does have underlying history of anxiety and depression and has been on Lexapro  before but per daughter, after dosage increase, she started to experience worsening dizziness, slurred speech and word finding difficulty which prompted the ED evaluation on 9/11 but per ED note, unclear if symptoms related to dosage  increase but either way this mediation was stopped. Per pt and daughter, they spoke to PCP yesterday about likely underlying depression/anxiety but was told to "talk to your dementia doctor about it". She she has remained on Aricept , denies side effects.  Remains on trazodone  100 mg nightly, denies side effects, does endorse some improvement of sleep but can still have some difficulty. She does still have a caregiver during the day.  Maintains ADLs independently.  Needs assistance for IADLs.  No further concerns at this time.  Update 11/14/2020 JM: Ms. Haskins returns for 6 month follow up accompanied by her daughter.  Cognition overall stable without worsening.  Continues to have difficulty with sleeping - she will sleep good for 3-4 hours but then will not be able to go back to sleep. Also having increased anxiety and agitation.  She continues live with her daughter who is her main caregiver but does have a Comptroller during the day.  She is able to maintain ADLs independently.  Continues on trazodone  75mg  daily and Aricept  10mg  daily.  No new concerns at this time  Update 05/11/2020 JM: Ms. Miceli returns for follow-up after prior visit approximately 4 months ago.  She is accompanied by her daughter [name]  Per daughter, she has been doing very well since prior visit and believes she has even been improving especially since starting Aricept .  She is independent with ADLs, ambulates without assistive device and denies any recent falls.  She does have underlying anxiety and insomnia but no other behavioral concerns.  On trazodone  50 mg nightly with some improvement of her sleep -currently sleeping approximately 4 hours at a time where previously would only sleep for 1 to 2 hours.  Trazodone  has also helped with anxiety. MMSE today 24/30 (prior 20/30)  Other concerns: Continued weight loss approximately 10 pounds since prior visit as she was limited in what she can eat as she was unable to use her dentures.  She  is currently working with dentist to get new dentures and is hopeful to have these within the next 1 to 2 weeks.  Since prior visit, she was seen at [place], [place] for bleeding colonic polyps - removed 2 during admission - since then, no further bleeding issues  Seen rheumatology Monday for further evaluation of arthritis.  She had limited use of her hands with increased swelling and severe pain limiting ADLs and daily functioning. Was started on meloxicam and prednisone for total of 5 days - daughter is not sure if they are supposed to discontinue after that time or if they were supposed to follow-up.  Apparently, patient made a comment regarding use of meloxicam and being on Eliquis  but per patient and daughter, he did not express any concerns.  She has not noticed any issues with bleeding or increased bruising  Update 01/18/2020 JM: She returns for follow-up after last visit 2 months ago.  She is accompanied by her daughter.  Patient had trouble tolerating Namenda  and developed increased hallucinations and felt like she was drunk.  She states she also broke out into sores.  She called the office and we advised her to taper and stop it.  She is started feeling better immediately after that.  Patient was then started on Aricept  and she is currently taking 5 mg daily since 12/31/2019 and she states states she is already feeling better.  Her bad dreams are gone and hallucinations have also improved.  She is tolerating it well without upset stomach nausea or diarrhea.  She is however been progressively losing weight for the last 6 months or so.  She however denies lack of energy or tiredness.  She underwent EEG on 12/06/2019 which showed mild generalized slowing without epileptiform activity.  Lab work on 11/09/2019 showed normal vitamin B12, TSH and RPR.  MRI scan of the brain on 11/01/2019 showed no acute abnormalities.  Patient has no new complaints.   Initial visit 11/09/2019 Dr. Janett Medin: Ms. Romack is a pleasant  89 year old Caucasian lady seen today for initial office consultation visit for aphasia.  She is accompanied by her daughter.  History is obtained from them, review of electronic medical records and I personally reviewed pertinent available imaging films in PACS.  She has past medical history of nonrheumatic aortic stenosis, s/p TAVR, paroxysmal A. fib on chronic anticoagulation with Eliquis , hypertension, coronary artery disease.  She presented to [facility] on 10/30/2019 with a 10-day history of worsening speech and difficulty articulating and dizziness.  Lab work was significant only for low potassium of 2.9 and CT scan of the head showed no acute abnormality and CT angiogram showed no significant large vessel intracranial extracranial stenosis.  MRI scan of the brain was also obtained which showed no acute abnormalities.  LDL cholesterol 94 mg percent hemoglobin A1c was 5.5.  B12 and TSH were normal.  Echo was not done.  Patient had been started on Lexapro  which was discontinued in the hospital and her speech difficulties improved.  However the daughter feels that she has noticed significant cognitive decline and memory difficulties since then.  On inquiry she states feels that this has been going on but has clearly worsened in the last 1 month.  Patient requires information to be given multiple times since she still forgets.  She is required more help with activities of daily living recently.  She does also occasionally get dizzy and feels avoid a straight into the right.  She is also seeing shadows in her room particularly at nighttime.  On Mini-Mental status exam today she scored 20/30 in the office and then significant problems with visual-spatial skills, animal naming and drawing a clock.  She is not had any work-up for reversible causes of memory loss or treatment for Alzheimer's.  There is no family stable tremors.  Patient has no prior history of known stroke, TIA, seizures, significant head  injury with loss of consciousness.  She has not exhibited any unsafe behavior.  She denies any delusions.  She is mostly calm and well behaved but does get irritable and agitated at times but there is no violent behavior.  She is not demonstrated tendency to wander off.  She does not drive.    ROS:   14 system review of systems is positive for those listed in HPI and all other systems negative    PMH:  Past Medical History:  Diagnosis Date   Atrial fibrillation (HCC)    Coronary artery disease    Hypertension    Nonrheumatic aortic (valve) stenosis     Social History:  Social History   Socioeconomic History   Marital status: Widowed    Spouse name: Not on file   Number of children: Not on file   Years of education: Not on file   Highest education level: Not on file  Occupational History   Not on file  Tobacco Use   Smoking status: Former    Current packs/day: 0.00    Types: Cigarettes    Quit date: 2003    Years since quitting: 22.3   Smokeless tobacco: Never  Vaping Use   Vaping status: Never Used  Substance and Sexual Activity   Alcohol use: Not Currently   Drug use: Never   Sexual activity: Not Currently  Other Topics Concern   Not on file  Social History Narrative   Lives with daughter [name]   Right Handed   Drinks no caffeine   Social Drivers of Corporate investment banker Strain: Not on file  Food Insecurity: Not on file  Transportation Needs: Not on file  Physical Activity: Not on file  Stress: Not on file  Social Connections: Not on file  Intimate Partner Violence: Not on file    Medications:   Current Outpatient Medications on File Prior to Visit  Medication Sig Dispense Refill   apixaban  (ELIQUIS ) 2.5 MG TABS tablet Take by mouth 2 (two) times daily.     furosemide  (LASIX ) 20 MG tablet Take 1 tablet (20 mg total) by mouth daily as needed. (Patient taking differently: Take 20 mg by mouth daily as needed. 1 tab mon, wed, fri. 1/2 other days)  30 tablet    meloxicam (MOBIC) 7.5 MG tablet as needed.      metoprolol  tartrate (LOPRESSOR ) 50  MG tablet Take 50 mg by mouth 2 (two) times daily.     omeprazole (PRILOSEC) 20 MG capsule Take 20 mg by mouth daily before breakfast.     potassium chloride  (KLOR-CON ) 10 MEQ tablet Take 10 mEq by mouth 2 (two) times daily.     No current facility-administered medications on file prior to visit.    Allergies:   Allergies  Allergen Reactions   Betadine [Povidone Iodine] Other (See Comments)   Latex Other (See Comments)    unkn   Penicillins Swelling   Sulfa Antibiotics Other (See Comments)    unkn    Physical Exam Today's Vitals   07/08/23 1406  BP: 110/70  Pulse: 70  Weight: 107 lb (48.5 kg)  Height: 4\' 11"  (1.499 m)    Body mass index is 21.61 kg/m.  General: Frail very pleasant elderly Caucasian lady seated, in no evident distress  Neurologic Exam Mental Status: Awake and fully alert. Recent memory diminished and remote memory intact. Attention span, concentration and fund of knowledge largely appropriate during visit. Mood and affect appropriate.  Hesitant speech but no true aphasia or dysarthria.     07/08/2023    2:09 PM 12/25/2022    2:15 PM 07/30/2022    2:57 PM  MMSE - Mini Mental State Exam  Orientation to time 4 4 5   Orientation to Place 5 5 5   Registration 3 3 3   Attention/ Calculation 0 5 1  Recall 2 2 2   Language- name 2 objects 2 2 2   Language- repeat 1 1 1   Language- follow 3 step command 2 2 3   Language- read & follow direction 1 1 1   Write a sentence 1 1 1   Copy design 0 1 0  Total score 21 27 24    Cranial Nerves: Pupils equal, briskly reactive to light. Extraocular movements full without nystagmus. Visual fields full to confrontation. Hearing diminished bilaterally.  Facial sensation intact. Face, tongue, palate moves normally and symmetrically.  Motor: Normal bulk and tone. Normal strength in all tested extremity muscles.  No evidence of action or  resting tremors today.  No cogwheel rigidity.  No bradykinesia. Sensory.: intact to touch , pinprick , position and vibratory sensation.  Coordination: Rapid alternating movements normal in all extremities. Finger-to-nose and heel-to-shin performed accurately bilaterally. Gait and Station: Arises from chair without difficulty. Stance is slightly stooped. Gait demonstrates slightly decreased stride length and step height with mild unsteadiness especially with turns, decreased right arm swing (although c/o shoulder pain), no pill rolling tremor.  Tandem walk and heel toe not attempted. Reflexes: 1+ and symmetric. Toes downgoing.       ASSESSMENT/PLAN: Natallie Ravenscroft is a very pleasant 89 year old Caucasian lady with progressive memory, cognitive and speech issues evaluated by Dr. Janett Medin 11/09/2019 likely from underlying dementia    Moderate dementia -Cognition stable MMSE today 21/30 (prior 27/30) -question possible AD with behaviors due to family hx vs Lewy body dementia with visual hallucinations, possible autonomic dysfunction contributing to dizziness, and impaired gait vs bvFTD with progressive changes in personality and behavior and compulsive behaviors.  Referral placed to neuro psychology for neurocognitive evaluation.  -Prolonged discussion regarding risk versus benefit of medications to help behaviors including hallucinations. At this point, patient is causing herself physical harm when agitated (pounding on tables chairs causing bruising) and hallucinations causing agitation, I do believe escalation of medication is warranted.  Did discuss potential side effects with increase sertraline  dosage and advised daughter to call with any difficulty tolerating -  Increase sertraline  to 50 mg nightly for agitation/aggression and hallucinations causing agitation -Consider trial of Rexulti if symptoms persist -Continue Aricept  10 mg nightly - may consider holding if concern of bvFTD -Discussed  completion of ATN profile to evaluate for AD but daughter wishes to hold off at this time -Continue trazodone  100 mg nightly -hesitant to increase further due to already high dose -Discussed trialing melatonin 5 mg nightly to further help with occasional insomnia -Dementia panel WNL -MR brain 10/2019 frontal lobe cerebral atrophy with no acute abnormalities -EEG mild slowing but no seizure activity noted -Intolerant to Namenda  - reported visual hallucinations, "drunk" and rash - Suspect caregiver burnout, daughter provided information for [facility] dementia family support program  Dizziness - PCP recently noted orthostatic hypotension with recent adjustments to blood pressure medication regimen - Discussed rising slowly from seated position especially after prolonged sitting and use of thigh-high compression stockings - Discussed importance of adequate water intake - Highly encouraged use of AD although patient declines - Long discussion regarding concern of recurrent falls on Eliquis .  If falls become more frequent, would recommend discussion of risk versus benefit with ongoing use with cardiologist      Follow-up in 7 months or call earlier if needed    CC:  Vasireddy, Sabitha, MD   I spent 45 minutes of face-to-face and non-face-to-face time with patient and daughter.  This included previsit chart review, lab review, study review, order entry, electronic health record documentation, patient and daughter education and discussion regarding above diagnoses and treatment plan and answered all other questions to patient and daughters satisfaction  Johny Nap, [facility]  [facility] [address] [place], [place] [address]  Phone [phone] Fax [phone] Note: This document was prepared with digital dictation and possible smart phrase technology. Any transcriptional errors that result from this process are unintentional.

## 2023-07-09 ENCOUNTER — Telehealth: Payer: Self-pay | Admitting: Adult Health

## 2023-07-09 NOTE — Telephone Encounter (Signed)
 Referral for neuropsychology fax to [facility]. Phone: [phone], Fax: [phone]

## 2024-02-03 ENCOUNTER — Ambulatory Visit: Admitting: Adult Health

## 2024-02-03 ENCOUNTER — Encounter: Payer: Self-pay | Admitting: Adult Health

## 2024-02-03 VITALS — BP 133/68 | HR 93 | Ht <= 58 in | Wt 113.8 lb

## 2024-02-03 DIAGNOSIS — F03C18 Unspecified dementia, severe, with other behavioral disturbance: Secondary | ICD-10-CM

## 2024-02-03 DIAGNOSIS — F03B18 Unspecified dementia, moderate, with other behavioral disturbance: Secondary | ICD-10-CM

## 2024-02-03 MED ORDER — TRAZODONE HCL 100 MG PO TABS: 100.0000 mg | ORAL_TABLET | Freq: Every day | ORAL | 3 refills | Status: AC

## 2024-02-03 MED ORDER — SERTRALINE HCL 50 MG PO TABS: 50.0000 mg | ORAL_TABLET | Freq: Every day | ORAL | 3 refills | Status: AC

## 2024-02-03 MED ORDER — DONEPEZIL HCL 10 MG PO TABS: 10.0000 mg | ORAL_TABLET | Freq: Every day | ORAL | 3 refills | Status: AC

## 2024-02-03 NOTE — Progress Notes (Signed)
 [name] [address] [place].  [address] [phone]       OFFICE FOLLOW UP VISIT NOTE  [name] Date of Birth:  01-04-1935 Medical Record Number:  [id number]    Primary neurologist: Dr. Rosemarie Reason for visit: dementia   Chief Complaint  Patient presents with   Follow-up    Patient is in room 8, patient is occupied with her daughter.  Patient is here for a follow up, patient states that she has no new symptoms.         HPI:  Update 02/03/2024 JM: Patient returns for follow-up visit accompanied by her daughter.  Patient reports overall she feels she has been doing well.  She does continue to have some nights with difficulty staying asleep.  She is currently taking melatonin 6 mg nightly, she also takes omeprazole, trazodone  and sertraline  at night.  Daughter notes improvement of behaviors with increased sertraline  dosage.  She feels cognition has overall been stable without any significant decline. MMSE 20/30, prior 21/30  Completed neurocognitive evaluation in October by Dr. Vivian which showed essentially global, profound cognitive impairment with multiple measures being eliminated from testing battery discontinued due to degree of cognitive impairment.also noted moderate psychomotor agitation, slight tremors and frequent repetitive mouth/jaw movements which appear to be consistent with some sort of dyskinesia.  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  Recommended further evaluation with genetic labs/biomarkers, repeat MRI brain and PET scan.   Further discussed neurocognitive evaluation with patient and daughter today.  Reviewed neuropsychology recommendations with further lab work and imaging.  Patient and daughter are not overly interested in pursuing further evaluation that may be costly and not fully covered by  insurance.  Discussed discontinuing donepezil  as this could potentially be making behaviors worse if dementia more related to FTD but daughter is concerned that if this were to be stopped, her dementia would quickly decline. She feels she has stabilized since being on this medication over the past 4 years and very hesitant to discontinue or even lower dosage. Daughter notes frequent movements of her mouth and licking her lips, she does not do this when she is wearing her dentures. She does have occasional dizziness still but thankfully no recent falls.  She continues to be followed by cardiologist in [place] as well as routine follow-up with PCP.     History provided for reference purposes only Update 07/08/2023 JM: Patient returns for 3-month follow-up accompanied by her daughter.  Reports continued gradual decline and increased agitation. She can easily get frustrated if she is told to do something such as using her walker or sitting down when feeling dizzy, she will slam her fists onto a table causing bruising. It can be difficult for her daughter to redirect.  She is also having increased visual hallucinations especially at night which has been causing agitation. Sleeps poorly at night, naps frequently during the day. Remains on donepezil  10 mg nightly, sertraline  25 mg daily and trazodone  100 mg nightly.  Daughter is primary caregiver but still working 6 days per week, do suspect caregiver burnout.  Reports she has 2 more years until she is able to retire.   Reports a fall back in February and over this past weekend but thankfully without hitting her head or significant injury. Does c/o right shoulder pain.  Reports continued intermittent dizziness, PCP recently noted orthostatic hypotension  and recommended reduction of metoprolol  dosing to 25 mg twice daily and discontinued furosemide .  She prefers not to use rolling walker despite daughter encouraging this, can quickly increase agitation.   Update  12/25/2022 JM: Patient returns for follow-up visit accompanied by her daughter.  At prior visit with Dr. Rosemarie, Depakote 250mg  BID initiated in addition to sertraline  and trazodone  for agitation and continued on donepezil .  Patient has been doing well since prior visit and overall stable. Did not start Depakote due to concern of potential side effects. Continues to have visual hallucinations and night terrors at times but not nightly, does not act out dreams. No other significant behavioral concerns.  Remains on donepezil , sertraline  and trazodone . Lives with daughter, able to maintain majority ADLs independently. Sister in law stays with her during the day. At times can feel off balance but no recent falls, does not use AD. C/o intermittent dizziness with room spinning sensation, lasts only short duration, unsure if triggered by quick head movements or quick position changes. Daughter notes she stays very active during the day, will make position changes very quickly such as bending over to pick something up or turning quickly. Does drink plenty of water during the day adequate nutrition.  Does not routinely monitor blood pressure at home, slightly elevated today but asymptomatic.  Routinely follows with cardiology, discontinue digoxin about 1 month ago, remains on metoprolol  and Eliquis .  Update 6/12/24/2022 Dr. Rosemarie: She returns for follow-up after last visit 8 months ago.  She is accompanied by her daughter.  Patient continues to live with her daughter.  Continues to have mild dementia and cognitive impairment but of late has been getting more confused and agitated at night.  He can mostly be redirected but at times is quite frustrated.  He is also been having increased hallucinations since his people in the room.  He remains on Aricept  10 mg daily which is tolerating well without side effects.  He can be left alone for few hours but not prolonged.  He has not had any violent behavior or exhibited any unsafe  behavior.  He is able to ambulate quite well with good balance and has had no falls or injuries.  He remains on Eliquis  2.5 mg twice daily and does bruise easily but has not had any major bleeding episodes.  On Mini-Mental status testing today she scored 20/30 which is stable from last visit.  Patient is on Zoloft  25 mg at night.  She has not tried medications like Depakote or Risperdal for agitation t yet   Update 11/15/2021 JM: Patient returns for 16-month dementia follow-up accompanied by her daughter.  Reports she has been doing well overall since prior visit.  Daughter believes memory has been improving and has been doing more things independently.  She has remained on Aricept , trazodone  and sertraline . Agaitation and being fidgety has improved since initiating sertraline .  Continues to sleep well most nights, can have some nights she may only get a few hours of sleep before she will wake up but eventually will fall back to sleep.  She does mention frontal head pressure that is necessarily painful but more annoying, can be worse when it rains, does have congestion and runny nose. Takes Zyrtec, recently increased to 1 tablet daily, previously taking half a tab. Tries use of Flonase but felt this made rhinorrhea worse.  She is being followed by her PCP for this concern.  No further concerns at this time.  Update 05/15/2021 JM: Patient returns for  4-month dementia follow-up accompanied by her daughter.  Reports cognition stable since prior visit.  MMSE today 24/30 (prior 25/30).  Daughter reports she has been more agitated which patient also agrees with. She is frustrated due to her current health condition with decline in hearing and vision, chronic pain, different medications, etc. she does have underlying history of anxiety and depression and has been on Lexapro  before but per daughter, after dosage increase, she started to experience worsening dizziness, slurred speech and word finding difficulty which  prompted the ED evaluation on 9/11 but per ED note, unclear if symptoms related to dosage increase but either way this mediation was stopped. Per pt and daughter, they spoke to PCP yesterday about likely underlying depression/anxiety but was told to talk to your dementia doctor about it. She she has remained on Aricept , denies side effects.  Remains on trazodone  100 mg nightly, denies side effects, does endorse some improvement of sleep but can still have some difficulty. She does still have a caregiver during the day.  Maintains ADLs independently.  Needs assistance for IADLs.  No further concerns at this time.  Update 11/14/2020 JM: Ms. Greenfield returns for 6 month follow up accompanied by her daughter.  Cognition overall stable without worsening.  Continues to have difficulty with sleeping - she will sleep good for 3-4 hours but then will not be able to go back to sleep. Also having increased anxiety and agitation.  She continues live with her daughter who is her main caregiver but does have a comptroller during the day.  She is able to maintain ADLs independently.  Continues on trazodone  75mg  daily and Aricept  10mg  daily.  No new concerns at this time  Update 05/11/2020 JM: Ms. Callari returns for follow-up after prior visit approximately 4 months ago.  She is accompanied by her daughter Lindsay Scott  Per daughter, she has been doing very well since prior visit and believes she has even been improving especially since starting Aricept .  She is independent with ADLs, ambulates without assistive device and denies any recent falls.  She does have underlying anxiety and insomnia but no other behavioral concerns.  On trazodone  50 mg nightly with some improvement of her sleep -currently sleeping approximately 4 hours at a time where previously would only sleep for 1 to 2 hours.  Trazodone  has also helped with anxiety. MMSE today 24/30 (prior 20/30)  Other concerns: Continued weight loss approximately 10 pounds since prior  visit as she was limited in what she can eat as she was unable to use her dentures.  She is currently working with dentist to get new dentures and is hopeful to have these within the next 1 to 2 weeks.  Since prior visit, she was seen at [place], [place] for bleeding colonic polyps - removed 2 during admission - since then, no further bleeding issues  Seen rheumatology Monday for further evaluation of arthritis.  She had limited use of her hands with increased swelling and severe pain limiting ADLs and daily functioning. Was started on meloxicam and prednisone for total of 5 days - daughter is not sure if they are supposed to discontinue after that time or if they were supposed to follow-up.  Apparently, patient made a comment regarding use of meloxicam and being on Eliquis  but per patient and daughter, he did not express any concerns.  She has not noticed any issues with bleeding or increased bruising  Update 01/18/2020 JM: She returns for follow-up after last visit 2 months ago.  She is accompanied by her daughter.  Patient had trouble tolerating Namenda  and developed increased hallucinations and felt like she was drunk.  She states she also broke out into sores.  She called the office and we advised her to taper and stop it.  She is started feeling better immediately after that.  Patient was then started on Aricept  and she is currently taking 5 mg daily since 12/31/2019 and she states states she is already feeling better.  Her bad dreams are gone and hallucinations have also improved.  She is tolerating it well without upset stomach nausea or diarrhea.  She is however been progressively losing weight for the last 6 months or so.  She however denies lack of energy or tiredness.  She underwent EEG on 12/06/2019 which showed mild generalized slowing without epileptiform activity.  Lab work on 11/09/2019 showed normal vitamin B12, TSH and RPR.  MRI scan of the brain on 11/01/2019 showed no acute abnormalities.   Patient has no new complaints.   Initial visit 11/09/2019 Dr. Rosemarie: Ms. Dorman is a pleasant 89 year old Caucasian lady seen today for initial office consultation visit for aphasia.  She is accompanied by her daughter.  History is obtained from them, review of electronic medical records and I personally reviewed pertinent available imaging films in PACS.  She has past medical history of nonrheumatic aortic stenosis, s/p TAVR, paroxysmal A. fib on chronic anticoagulation with Eliquis , hypertension, coronary artery disease.  She presented to [facility] on 10/30/2019 with a 10-day history of worsening speech and difficulty articulating and dizziness.  Lab work was significant only for low potassium of 2.9 and CT scan of the head showed no acute abnormality and CT angiogram showed no significant large vessel intracranial extracranial stenosis.  MRI scan of the brain was also obtained which showed no acute abnormalities.  LDL cholesterol 94 mg percent hemoglobin A1c was 5.5.  B12 and TSH were normal.  Echo was not done.  Patient had been started on Lexapro  which was discontinued in the hospital and her speech difficulties improved.  However the daughter feels that she has noticed significant cognitive decline and memory difficulties since then.  On inquiry she states feels that this has been going on but has clearly worsened in the last 1 month.  Patient requires information to be given multiple times since she still forgets.  She is required more help with activities of daily living recently.  She does also occasionally get dizzy and feels avoid a straight into the right.  She is also seeing shadows in her room particularly at nighttime.  On Mini-Mental status exam today she scored 20/30 in the office and then significant problems with visual-spatial skills, animal naming and drawing a clock.  She is not had any work-up for reversible causes of memory loss or treatment for Alzheimer's.  There is no family  stable tremors.  Patient has no prior history of known stroke, TIA, seizures, significant head injury with loss of consciousness.  She has not exhibited any unsafe behavior.  She denies any delusions.  She is mostly calm and well behaved but does get irritable and agitated at times but there is no violent behavior.  She is not demonstrated tendency to wander off.  She does not drive.    ROS:   14 system review of systems is positive for those listed in HPI and all other systems negative    PMH:  Past Medical History:  Diagnosis Date   Atrial fibrillation (HCC)  Coronary artery disease    Hypertension    Nonrheumatic aortic (valve) stenosis     Social History:  Social History   Socioeconomic History   Marital status: Widowed    Spouse name: Not on file   Number of children: Not on file   Years of education: Not on file   Highest education level: Not on file  Occupational History   Not on file  Tobacco Use   Smoking status: Former    Current packs/day: 0.00    Types: Cigarettes    Quit date: 2003    Years since quitting: 22.9   Smokeless tobacco: Never  Vaping Use   Vaping status: Never Used  Substance and Sexual Activity   Alcohol use: Not Currently   Drug use: Never   Sexual activity: Not Currently  Other Topics Concern   Not on file  Social History Narrative   Lives with daughter Lindsay Scott   Right Handed   Drinks no caffeine   Patient has a sitter that stays with her when Lindsay Scott is working.    Patient is retired.    Social Drivers of Health   Tobacco Use: Medium Risk (02/03/2024)   Patient History    Smoking Tobacco Use: Former    Smokeless Tobacco Use: Never    Passive Exposure: Not on Actuary Strain: Not on file  Food Insecurity: Not on file  Transportation Needs: Not on file  Physical Activity: Not on file  Stress: Not on file  Social Connections: Not on file  Intimate Partner Violence: Not on file  Depression (EYV7-0): Not on  file  Alcohol Screen: Not on file  Housing: Not on file  Utilities: Not on file  Health Literacy: Not on file    Medications:   Current Outpatient Medications on File Prior to Visit  Medication Sig Dispense Refill   apixaban  (ELIQUIS ) 2.5 MG TABS tablet Take by mouth 2 (two) times daily.     donepezil  (ARICEPT ) 10 MG tablet Take 1 tablet (10 mg total) by mouth at bedtime. 90 tablet 3   furosemide  (LASIX ) 20 MG tablet Take 1 tablet (20 mg total) by mouth daily as needed. (Patient taking differently: Take 20 mg by mouth daily as needed. 1 tab mon, wed, fri. 1/2 other days) 30 tablet    meloxicam (MOBIC) 7.5 MG tablet as needed.      metoprolol  tartrate (LOPRESSOR ) 50 MG tablet Take 50 mg by mouth 2 (two) times daily.     omeprazole (PRILOSEC) 20 MG capsule Take 20 mg by mouth daily before breakfast.     potassium chloride  (KLOR-CON ) 10 MEQ tablet Take 10 mEq by mouth 2 (two) times daily.     sertraline  (ZOLOFT ) 50 MG tablet Take 1 tablet (50 mg total) by mouth at bedtime. 30 tablet 11   traZODone  (DESYREL ) 100 MG tablet Take 1 tablet (100 mg total) by mouth at bedtime. 90 tablet 3   No current facility-administered medications on file prior to visit.    Allergies:   Allergies  Allergen Reactions   Betadine [Povidone Iodine] Other (See Comments)   Latex Other (See Comments)    unkn   Penicillins Swelling   Sulfa Antibiotics Other (See Comments)    unkn    Physical Exam Today's Vitals   02/03/24 1419  BP: 133/68  Pulse: 93  Weight: 113 lb 12.8 oz (51.6 kg)  Height: 4' 10 (1.473 m)   Body mass index is 23.78 kg/m.  General: Frail very  pleasant elderly Caucasian lady seated, in no evident distress  Neurologic Exam Mental Status: Awake and fully alert. Recent memory diminished and remote memory intact. Attention span, concentration and fund of knowledge largely appropriate during visit. Mood and affect appropriate.  Hesitant speech but no true aphasia or dysarthria.      02/03/2024    2:50 PM 07/08/2023    2:09 PM 12/25/2022    2:15 PM  MMSE - Mini Mental State Exam  Orientation to time 3 4 4   Orientation to Place 5 5 5   Registration 3 3 3   Attention/ Calculation 0 0 5  Recall 3 2 2   Language- name 2 objects 2 2 2   Language- repeat 0 1 1  Language- follow 3 step command 2 2 2   Language- read & follow direction 1 1 1   Write a sentence 1 1 1   Copy design 0 0 1  Total score 20 21 27    Cranial Nerves: Pupils equal, briskly reactive to light. Extraocular movements full without nystagmus. Visual fields full to confrontation. Hearing diminished bilaterally.  Facial sensation intact. Face, tongue, palate moves normally and symmetrically. Frequent mouth movements during visit Motor: Normal bulk and tone. Normal strength in all tested extremity muscles.  No evidence of action or resting tremors today.  No cogwheel rigidity.  No bradykinesia. Sensory.: intact to touch , pinprick , position and vibratory sensation.  Coordination: Rapid alternating movements normal in all extremities. Finger-to-nose and heel-to-shin performed accurately bilaterally. Gait and Station: Arises from chair without difficulty. Stance is slightly stooped. Gait demonstrates slightly decreased stride length and step height with mild unsteadiness especially with turns, decreased right arm swing (although c/o shoulder pain), no pill rolling tremor.  Tandem walk and heel toe not attempted.       ASSESSMENT/PLAN: Rosaria Kubin is a very pleasant 89 year old Caucasian lady with progressive memory, cognitive and speech issues evaluated by Dr. Rosemarie 11/09/2019 likely from underlying dementia. Having issues with agitation and behaviors which gradually improved on trazodone  and sertraline .  Having occasional issues with insomnia.    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  -patient and daughter decline interest in further evaluation at this time. They were encouraged to call if interested in pursuing in the future -as noted in HPI, declines interest in stopping Aricept . Discussion regarding if dementia due to FTD, this could potentially make behaviors worse -will continue donepezil  10 mg for now, recommend switching to morning to see if this helps with insomnia and vivid dreams -increase melatonin to 9mg  nightly to further help with insomnia -continue trazodone  100 mg nightly, can consider slightly increasing dose if no benefit after above adjustments. Did discuss potential side effects if dosage were to be increased.  -continue sertraline  50 mg nightly as this has been beneficial for agitation and behaviors -Dementia panel WNL -MR brain 10/2019 frontal lobe cerebral atrophy with no acute abnormalities -EEG mild slowing but no seizure activity noted -Intolerant to Namenda  - reported visual hallucinations, drunk and rash        Follow-up in 6 months or call earlier if needed    CC:  Luetta Chew, MD   I personally spent a total of 60 minutes in the care of the patient today including preparing to see  the patient, getting/reviewing separately obtained history, performing a medically appropriate exam/evaluation, counseling and educating, placing orders, and documenting clinical information in the EHR.   Harlene Bogaert, AGNP-BC  [facility] [address] [place], [place] [address]  Phone [phone] Fax [phone] Note: This document was prepared with digital dictation and possible smart phrase technology. Any transcriptional errors that result from this process are unintentional.

## 2024-02-03 NOTE — Patient Instructions (Signed)
 Your Plan:  Continue melatonin but increase to 9mg  nightly  Try to switch Aricept  to morning dosage   Continue trazodone  100mg  nightly   Continue sertraline  50mg  nightly   May need to consider gradually coming off Aricept  in the future as this could worsen behaviors but will continue for now  Please call if you are interested in pursuing further work up with lab work and/or imaging      Follow up in 6 months or call earlier if needed     Thank you for coming to see us  at [facility]. I hope we have been able to provide you high quality care today.  You may receive a patient satisfaction survey over the next few weeks. We would appreciate your feedback and comments so that we may continue to improve ourselves and the health of our patients.

## 2024-02-20 ENCOUNTER — Emergency Department (HOSPITAL_COMMUNITY)

## 2024-02-20 ENCOUNTER — Encounter (HOSPITAL_COMMUNITY): Payer: Self-pay | Admitting: *Deleted

## 2024-02-20 ENCOUNTER — Observation Stay (HOSPITAL_COMMUNITY)
Admission: EM | Admit: 2024-02-20 | Discharge: 2024-02-21 | Disposition: A | Discharge status: Home / Self Care | Source: Ambulatory Visit | Attending: Internal Medicine | Admitting: Internal Medicine

## 2024-02-20 ENCOUNTER — Other Ambulatory Visit: Payer: Self-pay

## 2024-02-20 ENCOUNTER — Inpatient Hospital Stay (HOSPITAL_COMMUNITY)

## 2024-02-20 DIAGNOSIS — E782 Mixed hyperlipidemia: Secondary | ICD-10-CM | POA: Diagnosis not present

## 2024-02-20 DIAGNOSIS — D509 Iron deficiency anemia, unspecified: Principal | ICD-10-CM | POA: Insufficient documentation

## 2024-02-20 DIAGNOSIS — K5730 Diverticulosis of large intestine without perforation or abscess without bleeding: Secondary | ICD-10-CM | POA: Diagnosis not present

## 2024-02-20 DIAGNOSIS — D649 Anemia, unspecified: Secondary | ICD-10-CM | POA: Diagnosis not present

## 2024-02-20 DIAGNOSIS — I5032 Chronic diastolic (congestive) heart failure: Secondary | ICD-10-CM | POA: Diagnosis not present

## 2024-02-20 DIAGNOSIS — I2510 Atherosclerotic heart disease of native coronary artery without angina pectoris: Secondary | ICD-10-CM | POA: Diagnosis not present

## 2024-02-20 DIAGNOSIS — Z79899 Other long term (current) drug therapy: Secondary | ICD-10-CM | POA: Diagnosis not present

## 2024-02-20 DIAGNOSIS — D582 Other hemoglobinopathies: Secondary | ICD-10-CM | POA: Diagnosis present

## 2024-02-20 DIAGNOSIS — R600 Localized edema: Secondary | ICD-10-CM | POA: Insufficient documentation

## 2024-02-20 DIAGNOSIS — I480 Paroxysmal atrial fibrillation: Secondary | ICD-10-CM | POA: Diagnosis present

## 2024-02-20 DIAGNOSIS — J9811 Atelectasis: Secondary | ICD-10-CM | POA: Insufficient documentation

## 2024-02-20 DIAGNOSIS — E876 Hypokalemia: Secondary | ICD-10-CM | POA: Insufficient documentation

## 2024-02-20 DIAGNOSIS — R911 Solitary pulmonary nodule: Secondary | ICD-10-CM

## 2024-02-20 DIAGNOSIS — I700 Atherosclerosis of aorta: Secondary | ICD-10-CM | POA: Insufficient documentation

## 2024-02-20 DIAGNOSIS — I517 Cardiomegaly: Secondary | ICD-10-CM | POA: Insufficient documentation

## 2024-02-20 DIAGNOSIS — R918 Other nonspecific abnormal finding of lung field: Secondary | ICD-10-CM | POA: Insufficient documentation

## 2024-02-20 DIAGNOSIS — Z8679 Personal history of other diseases of the circulatory system: Secondary | ICD-10-CM | POA: Diagnosis not present

## 2024-02-20 DIAGNOSIS — I110 Hypertensive heart disease with heart failure: Secondary | ICD-10-CM | POA: Diagnosis not present

## 2024-02-20 DIAGNOSIS — I509 Heart failure, unspecified: Secondary | ICD-10-CM

## 2024-02-20 DIAGNOSIS — F0390 Unspecified dementia without behavioral disturbance: Secondary | ICD-10-CM | POA: Insufficient documentation

## 2024-02-20 DIAGNOSIS — Z91040 Latex allergy status: Secondary | ICD-10-CM | POA: Insufficient documentation

## 2024-02-20 LAB — BASIC METABOLIC PANEL WITH GFR
Anion gap: 14 (ref 5–15)
BUN: 13 mg/dL (ref 8–23)
CO2: 21 mmol/L — ABNORMAL LOW (ref 22–32)
Calcium: 9.2 mg/dL (ref 8.9–10.3)
Chloride: 106 mmol/L (ref 98–111)
Creatinine, Ser: 1.01 mg/dL — ABNORMAL HIGH (ref 0.44–1.00)
GFR, Estimated: 54 mL/min — ABNORMAL LOW
Glucose, Bld: 100 mg/dL — ABNORMAL HIGH (ref 70–99)
Potassium: 3.7 mmol/L (ref 3.5–5.1)
Sodium: 141 mmol/L (ref 135–145)

## 2024-02-20 LAB — CBC WITH DIFFERENTIAL/PLATELET
Abs Immature Granulocytes: 0.06 K/uL (ref 0.00–0.07)
Basophils Absolute: 0.1 K/uL (ref 0.0–0.1)
Basophils Relative: 1 %
Eosinophils Absolute: 0.1 K/uL (ref 0.0–0.5)
Eosinophils Relative: 1 %
HCT: 24.5 % — ABNORMAL LOW (ref 36.0–46.0)
Hemoglobin: 6.3 g/dL — CL (ref 12.0–15.0)
Immature Granulocytes: 1 %
Lymphocytes Relative: 13 %
Lymphs Abs: 1.5 K/uL (ref 0.7–4.0)
MCH: 18.2 pg — ABNORMAL LOW (ref 26.0–34.0)
MCHC: 25.7 g/dL — ABNORMAL LOW (ref 30.0–36.0)
MCV: 70.8 fL — ABNORMAL LOW (ref 80.0–100.0)
Monocytes Absolute: 0.8 K/uL (ref 0.1–1.0)
Monocytes Relative: 7 %
Neutro Abs: 9.2 K/uL — ABNORMAL HIGH (ref 1.7–7.7)
Neutrophils Relative %: 77 %
Platelets: 260 K/uL (ref 150–400)
RBC: 3.46 MIL/uL — ABNORMAL LOW (ref 3.87–5.11)
RDW: 17.2 % — ABNORMAL HIGH (ref 11.5–15.5)
WBC: 11.8 K/uL — ABNORMAL HIGH (ref 4.0–10.5)
nRBC: 0.2 % (ref 0.0–0.2)

## 2024-02-20 LAB — IRON AND TIBC
Iron: 45 ug/dL (ref 28–170)
Saturation Ratios: 10 % — ABNORMAL LOW (ref 10.4–31.8)
TIBC: 444 ug/dL (ref 250–450)
UIBC: 399 ug/dL

## 2024-02-20 LAB — RETICULOCYTES
Immature Retic Fract: 23.5 % — ABNORMAL HIGH (ref 2.3–15.9)
RBC.: 3.41 MIL/uL — ABNORMAL LOW (ref 3.87–5.11)
Retic Count, Absolute: 78.8 K/uL (ref 19.0–186.0)
Retic Ct Pct: 2.3 % (ref 0.4–3.1)

## 2024-02-20 LAB — PREPARE RBC (CROSSMATCH)

## 2024-02-20 LAB — FERRITIN: Ferritin: 27 ng/mL (ref 11–307)

## 2024-02-20 LAB — HEPARIN LEVEL (UNFRACTIONATED): Heparin Unfractionated: >1.1 [IU]/mL — ABNORMAL HIGH (ref 0.30–0.70)

## 2024-02-20 LAB — VITAMIN B12: Vitamin B-12: 3156 pg/mL — ABNORMAL HIGH (ref 180–914)

## 2024-02-20 LAB — PRO BRAIN NATRIURETIC PEPTIDE: Pro Brain Natriuretic Peptide: 2427 pg/mL — ABNORMAL HIGH

## 2024-02-20 LAB — ABO/RH: ABO/RH(D): O POS

## 2024-02-20 LAB — POC OCCULT BLOOD, ED

## 2024-02-20 LAB — TROPONIN T, HIGH SENSITIVITY: Troponin T High Sensitivity: <15 ng/L (ref 0–19)

## 2024-02-20 MED ORDER — ALBUTEROL SULFATE (2.5 MG/3ML) 0.083% IN NEBU: 2.5000 mg | INHALATION_SOLUTION | RESPIRATORY_TRACT | Status: DC | PRN

## 2024-02-20 MED ORDER — PANTOPRAZOLE SODIUM 40 MG PO TBEC
40.0000 mg | DELAYED_RELEASE_TABLET | Freq: Every day | ORAL | Status: DC
  Administered 2024-02-20: 40 mg via ORAL
  Filled 2024-02-20: qty 1

## 2024-02-20 MED ORDER — FUROSEMIDE 10 MG/ML IJ SOLN: 40.0000 mg | Freq: Every day | INTRAMUSCULAR | Status: DC

## 2024-02-20 MED ORDER — ENOXAPARIN SODIUM 60 MG/0.6ML IJ SOSY
50.0000 mg | PREFILLED_SYRINGE | Freq: Two times a day (BID) | INTRAMUSCULAR | Status: DC
  Filled 2024-02-20: qty 0.6

## 2024-02-20 MED ORDER — APIXABAN 2.5 MG PO TABS
2.5000 mg | ORAL_TABLET | Freq: Two times a day (BID) | ORAL | Status: DC
  Filled 2024-02-20: qty 1

## 2024-02-20 MED ORDER — FUROSEMIDE 10 MG/ML IJ SOLN
40.0000 mg | Freq: Every day | INTRAMUSCULAR | Status: DC
  Administered 2024-02-20: 40 mg via INTRAVENOUS
  Filled 2024-02-20: qty 4

## 2024-02-20 MED ORDER — POLYETHYLENE GLYCOL 3350 17 G PO PACK: 17.0000 g | PACK | Freq: Every day | ORAL | Status: DC | PRN

## 2024-02-20 MED ORDER — SODIUM CHLORIDE 0.9% IV SOLUTION: Freq: Once | INTRAVENOUS | Status: AC

## 2024-02-20 MED ORDER — METOPROLOL TARTRATE 50 MG PO TABS
50.0000 mg | ORAL_TABLET | Freq: Two times a day (BID) | ORAL | Status: DC
  Administered 2024-02-20 – 2024-02-21 (×2): 50 mg via ORAL
  Filled 2024-02-20 (×2): qty 1

## 2024-02-20 MED ORDER — SERTRALINE HCL 50 MG PO TABS
50.0000 mg | ORAL_TABLET | Freq: Every day | ORAL | Status: DC
  Administered 2024-02-20: 50 mg via ORAL
  Filled 2024-02-20: qty 1

## 2024-02-20 MED ORDER — ACETAMINOPHEN 650 MG RE SUPP: 650.0000 mg | Freq: Four times a day (QID) | RECTAL | Status: DC | PRN

## 2024-02-20 MED ORDER — DONEPEZIL HCL 5 MG PO TABS
10.0000 mg | ORAL_TABLET | Freq: Every day | ORAL | Status: DC
  Administered 2024-02-20 – 2024-02-21 (×2): 10 mg via ORAL
  Filled 2024-02-20 (×2): qty 2

## 2024-02-20 MED ORDER — SODIUM CHLORIDE 0.9% IV SOLUTION: Freq: Once | INTRAVENOUS | Status: DC

## 2024-02-20 MED ORDER — POTASSIUM CHLORIDE CRYS ER 20 MEQ PO TBCR
10.0000 meq | EXTENDED_RELEASE_TABLET | Freq: Two times a day (BID) | ORAL | Status: DC
  Administered 2024-02-20: 10 meq via ORAL
  Filled 2024-02-20: qty 1

## 2024-02-20 MED ORDER — MELATONIN 3 MG PO TABS
3.0000 mg | ORAL_TABLET | Freq: Every day | ORAL | Status: DC
  Administered 2024-02-20: 3 mg via ORAL
  Filled 2024-02-20: qty 1

## 2024-02-20 MED ORDER — FUROSEMIDE 10 MG/ML IJ SOLN
20.0000 mg | Freq: Once | INTRAMUSCULAR | Status: AC | PRN
  Administered 2024-02-20: 20 mg via INTRAVENOUS
  Filled 2024-02-20: qty 2

## 2024-02-20 MED ORDER — PANTOPRAZOLE SODIUM 40 MG PO TBEC
40.0000 mg | DELAYED_RELEASE_TABLET | Freq: Two times a day (BID) | ORAL | Status: DC
  Administered 2024-02-21: 40 mg via ORAL
  Filled 2024-02-20: qty 1

## 2024-02-20 MED ORDER — HYDRALAZINE HCL 20 MG/ML IJ SOLN: 5.0000 mg | INTRAMUSCULAR | Status: DC | PRN

## 2024-02-20 MED ORDER — TRAZODONE HCL 50 MG PO TABS
100.0000 mg | ORAL_TABLET | Freq: Every day | ORAL | Status: DC
  Administered 2024-02-20: 100 mg via ORAL
  Filled 2024-02-20: qty 2

## 2024-02-20 MED ORDER — ACETAMINOPHEN 325 MG PO TABS: 650.0000 mg | ORAL_TABLET | Freq: Four times a day (QID) | ORAL | Status: DC | PRN

## 2024-02-20 MED ORDER — FUROSEMIDE 10 MG/ML IJ SOLN: 40.0000 mg | Freq: Once | INTRAMUSCULAR | Status: DC

## 2024-02-20 NOTE — Consult Note (Addendum)
 Pharmacy Consult Note - Anticoagulation  Pharmacy Consult for enoxaparin Indication: atrial fibrillation  PATIENT MEASUREMENTS: Height: 4' 10 (147.3 cm) Weight: 47.2 kg (104 lb) IBW/kg (Calculated) : 40.9 HEPARIN DW (KG): 47.2  VITAL SIGNS: Temp: 98.6 F (37 C) (01/02 2127) Temp Source: Oral (01/02 2127) BP: 159/69 (01/02 2127) Pulse Rate: 76 (01/02 2127)  Recent Labs    02/20/24 1350 02/20/24 2121  HGB 6.3*  --   HCT 24.5*  --   PLT 260  --   HEPARINUNFRC  --  >1.10*  CREATININE 1.01*  --     Estimated Creatinine Clearance: 25.3 mL/min (A) (by C-G formula based on SCr of 1.01 mg/dL (H)).  PAST MEDICAL HISTORY: Past Medical History:  Diagnosis Date   Atrial fibrillation (HCC)    Coronary artery disease    Hypertension    Nonrheumatic aortic (valve) stenosis     ASSESSMENT: 89 y.o. female with PMH Afib on Eliquis  is presenting with symptomatic anemia now s/p two pRBC transfusions. Last dose of Eliquis  was this morning. Per chart, concern for GIB low on differential. Pharmacy has been consulted to initiate and manage enoxaparin.  Pertinent medications: Medications Prior to Admission  Medication Sig Dispense Refill Last Dose/Taking   apixaban  (ELIQUIS ) 2.5 MG TABS tablet Take by mouth 2 (two) times daily.      donepezil  (ARICEPT ) 10 MG tablet Take 1 tablet (10 mg total) by mouth daily. 90 tablet 3    furosemide  (LASIX ) 20 MG tablet Take 1 tablet (20 mg total) by mouth daily as needed. (Patient taking differently: Take 20 mg by mouth daily as needed. 1 tab mon, wed, fri. 1/2 other days) 30 tablet     meloxicam (MOBIC) 7.5 MG tablet as needed.       metoprolol  tartrate (LOPRESSOR ) 50 MG tablet Take 50 mg by mouth 2 (two) times daily.      omeprazole (PRILOSEC) 20 MG capsule Take 20 mg by mouth daily before breakfast.      potassium chloride  (KLOR-CON ) 10 MEQ tablet Take 10 mEq by mouth 2 (two) times daily.      sertraline  (ZOLOFT ) 50 MG tablet Take 1 tablet (50 mg  total) by mouth at bedtime. 90 tablet 3    traZODone  (DESYREL ) 100 MG tablet Take 1 tablet (100 mg total) by mouth at bedtime. 90 tablet 3     Baseline anticoagulation labs: Recent Labs    02/20/24 1350  HGB 6.3*  PLT 260    PLAN: Per discussion with admitting provider, will begin enoxaparin tomorrow morning Hold Eliquis  Initiate enoxaparin 1 mg/kg subQ q12H tomorrow morning due to low hemoglobin CBC daily for now  Will M. Lenon, PharmD, BCPS Clinical Pharmacist 02/20/2024 10:38 PM

## 2024-02-20 NOTE — ED Triage Notes (Signed)
 Pt's son reports that pt's PCP sent here for very low Hbg. SOB since Christmas and swelling to legs.

## 2024-02-20 NOTE — ED Provider Notes (Signed)
 " [place] EMERGENCY DEPARTMENT AT [facility] Provider Note   CSN: [id number] Arrival date & time: 02/20/24  1255     Patient presents with: No chief complaint on file.   Lindsay Scott is a 89 y.o. female.  Patient is a 89 year old female with a history of hypertension, hyperlipidemia, paroxysmal A-fib on Eliquis , and dementia who presents to the ED with son for evaluation after her PCP told her she had a low hemoglobin.  The son at bedside provides most the history as patient is a poor historian and does not know why she is at the emergency room.  Son states they received a call from her doctor today that her hemoglobin was dangerously low and she should go to the emergency room for evaluation.  Notes the blood work was drawn on Monday of this week.  Son states that since right before Christmas, patient has had increased leg swelling and shortness of breath.  They note that she cannot take more than 10 steps without feeling very short of breath.  She has been taking all medications including Lasix  and Eliquis  as prescribed.  Patient does note that her stools are very dark in color and that they occasionally have bright red streaks.  They note patient did fall before Christmas but was not seen after the fall.  Denies hitting head or loss of consciousness.  Denies fevers, cough, chest pain, nausea/vomiting, urinary symptoms.  No further complaints.   HPI     Prior to Admission medications  Medication Sig Start Date End Date Taking? Authorizing Provider  apixaban  (ELIQUIS ) 2.5 MG TABS tablet Take by mouth 2 (two) times daily.    [provider]  donepezil  (ARICEPT ) 10 MG tablet Take 1 tablet (10 mg total) by mouth daily. 02/03/24   Whitfield Raisin, NP  furosemide  (LASIX ) 20 MG tablet Take 1 tablet (20 mg total) by mouth daily as needed. Patient taking differently: Take 20 mg by mouth daily as needed. 1 tab mon, wed, fri. 1/2 other days 11/01/19   Arlice Reichert, MD   meloxicam (MOBIC) 7.5 MG tablet as needed.  09/21/19   [provider]  metoprolol  tartrate (LOPRESSOR ) 50 MG tablet Take 50 mg by mouth 2 (two) times daily.    [provider]  omeprazole  (PRILOSEC) 20 MG capsule Take 20 mg by mouth daily before breakfast.    [provider]  potassium chloride  (KLOR-CON ) 10 MEQ tablet Take 10 mEq by mouth 2 (two) times daily.    [provider]  sertraline  (ZOLOFT ) 50 MG tablet Take 1 tablet (50 mg total) by mouth at bedtime. 02/03/24   Whitfield Raisin, NP  traZODone  (DESYREL ) 100 MG tablet Take 1 tablet (100 mg total) by mouth at bedtime. 02/03/24   Whitfield Raisin, NP    Allergies: Betadine [povidone iodine], Latex, Penicillins, and Sulfa antibiotics    Review of Systems  Constitutional:  Negative for fever.  Respiratory:  Positive for shortness of breath.   Cardiovascular:  Positive for leg swelling. Negative for chest pain.  Gastrointestinal:  Positive for anal bleeding and diarrhea. Negative for abdominal pain, nausea and vomiting.  Neurological:  Negative for syncope and headaches.  All other systems reviewed and are negative.   Updated Vital Signs BP 124/64 (BP Location: Right Arm)   Pulse 69   Temp 98.4 F (36.9 C) (Oral)   Resp 17   Ht 4' 10 (1.473 m)   Wt 47.2 kg   SpO2 100%   BMI  21.74 kg/m   Physical Exam Constitutional:      Appearance: Normal appearance.  HENT:     Head: Normocephalic and atraumatic.     Nose: Nose normal.     Mouth/Throat:     Mouth: Mucous membranes are moist.     Pharynx: Oropharynx is clear.  Cardiovascular:     Rate and Rhythm: Normal rate.  Pulmonary:     Effort: Pulmonary effort is normal.     Breath sounds: Normal breath sounds.  Abdominal:     General: Bowel sounds are normal.     Palpations: Abdomen is soft.     Tenderness: There is no abdominal tenderness.  Musculoskeletal:        General: Normal range of motion.  Skin:    General: Skin is warm and dry.   Neurological:     Mental Status: She is alert.     Comments: Oriented to self and place but not time.  Confused at baseline per son.  Psychiatric:     Comments: Confusion, dementia history     (all labs ordered are listed, but only abnormal results are displayed) Labs Reviewed  CBC WITH DIFFERENTIAL/PLATELET - Abnormal; Notable for the following components:      Result Value   WBC 11.8 (*)    RBC 3.46 (*)    Hemoglobin 6.3 (*)    HCT 24.5 (*)    MCV 70.8 (*)    MCH 18.2 (*)    MCHC 25.7 (*)    RDW 17.2 (*)    Neutro Abs 9.2 (*)    All other components within normal limits  BASIC METABOLIC PANEL WITH GFR - Abnormal; Notable for the following components:   CO2 21 (*)    Glucose, Bld 100 (*)    Creatinine, Ser 1.01 (*)    GFR, Estimated 54 (*)    All other components within normal limits  PRO BRAIN NATRIURETIC PEPTIDE - Abnormal; Notable for the following components:   Pro Brain Natriuretic Peptide 2,427.0 (*)    All other components within normal limits  RETICULOCYTES - Abnormal; Notable for the following components:   RBC. 3.41 (*)    Immature Retic Fract 23.5 (*)    All other components within normal limits  POC OCCULT BLOOD, ED - Normal  VITAMIN B12  FOLATE  IRON AND TIBC  FERRITIN  TYPE AND SCREEN  PREPARE RBC (CROSSMATCH)  ABO/RH  PREPARE RBC (CROSSMATCH)  TROPONIN T, HIGH SENSITIVITY    EKG: None  Radiology: DG Chest 2 View Result Date: 02/20/2024 CLINICAL DATA:  Shortness of breath EXAM: CHEST - 2 VIEW COMPARISON:  None Available. FINDINGS: The heart size and mediastinal contours are within normal limits. Status post transcatheter aortic valve repair. Left lung is clear minimal right pleural effusion is noted with minimal right basilar subsegmental atelectasis. Nodular density seen in right midlung. CT scan is recommended for further evaluation. The visualized skeletal structures are unremarkable. IMPRESSION: 1. Nodular density seen in right midlung. CT scan  of the chest is recommended for further evaluation. 2. Minimal right pleural effusion is noted with minimal right basilar subsegmental atelectasis. Electronically Signed   By: Lynwood Landy Raddle M.D.   On: 02/20/2024 14:36      Medications Ordered in the ED  0.9 %  sodium chloride  infusion (Manually program via Guardrails IV Fluids) (has no administration in time range)  acetaminophen  (TYLENOL ) tablet 650 mg (has no administration in time range)    Or  acetaminophen  (TYLENOL ) suppository  650 mg (has no administration in time range)  polyethylene glycol (MIRALAX  / GLYCOLAX ) packet 17 g (has no administration in time range)  albuterol  (PROVENTIL ) (2.5 MG/3ML) 0.083% nebulizer solution 2.5 mg (has no administration in time range)  hydrALAZINE  (APRESOLINE ) injection 5 mg (has no administration in time range)  0.9 %  sodium chloride  infusion (Manually program via Guardrails IV Fluids) (has no administration in time range)    Clinical Course as of 02/20/24 1626  Fri Feb 20, 2024  1502 Occult stool negative [AY]    Clinical Course User Index [AY] Neysa Thersia RAMAN, PA-C                                Medical Decision Making Patient is a 89 year old female with a history of dementia, hypertension, hyperlipidemia, and paroxysmal A-fib on Eliquis  who presents to the ED with son from PCP for reportedly low hemoglobin in the fives that was drawn on Monday of this week.  Son notes increasing shortness of breath and leg swelling since right before Christmas.  Patient's PCP visit this week was her first follow-up.  Notes PCP is in [place] .  On exam patient is alert and in no acute distress.  Physical exam as noted above.  She is confused but at her baseline per son.  Lungs clear to auscultation.  Vital signs stable.  Lab workup significant for hemoglobin of 6.3 and mild leukocytosis of 11.8.  BNP also elevated around 2400.  Patient is on Lasix  daily and has been compliant per son.  There was  concerns for an acute GI bleed secondary to noting dark stools as well as increasing diarrhea and on Eliquis .  Occult stool was negative at bedside and rectal exam was normal.  Less concerns for acute GI bleed.  Chest x-ray was reviewed and shows a nodular density in the right midlung, advised CT for evaluation.  There is also a minimal right pleural effusion but otherwise no acute consolidation.  Suspect patient does have a CHF exacerbation as well as symptomatic anemia that is contributing to her shortness of breath.  Her vital signs have remained stable while in ED.  As patient is on Eliquis , we do feel she would benefit from admission for higher level of care.  A unit of blood has been ordered.  Hospitalist has been consulted and are agreeable to admit patient for further evaluation and management.  Patient stable while in ED.  Amount and/or Complexity of Data Reviewed Labs: ordered. Radiology: ordered.  Risk Prescription drug management. Decision regarding hospitalization.      Final diagnoses:  Symptomatic anemia  Low hemoglobin  Acute on chronic congestive heart failure, unspecified heart failure type [facility])  Lung nodule    ED Discharge Orders     None          Neysa Thersia RAMAN DEVONNA 02/20/24 1626    Cleotilde Rogue, MD 02/21/24 [phone]  "

## 2024-02-20 NOTE — H&P (Addendum)
 "                                                                                                          [facility] H&P   Patient Demographics:    Lindsay Scott, is a 89 y.o. female  MRN: [id number]   DOB - 05-27-1934  Admit Date - 02/20/2024  Outpatient Primary MD for the patient is Luetta Chew, MD  Outpatient Specialists: cardiology in [place]   No chief complaint on file.     HPI:    Lindsay Scott  is a 89 y.o. female,  with PMH of nonrheumatic aortic stenosis status post TAVR, paroxysmal A. fib on chronic anticoagulation with Eliquis , hypertension, CAD.  - Patient presents to ED upon instructions from her PCP for anemia, patient dementia, son at bedside help with the history, patient with progressive dyspnea over the last few weeks, as well as lower extremity edema, she was recently prescribed potassium and Lasix  by her PCP, unclear if she has diagnosis of CHF in the past, but she is with known history of paroxysmal A-fib on Eliquis , and history of aortic valve disease status post TAVR, denies fever, chills, she denies any hard stools or coffee-ground emesis, apparently her hemoglobin was low at PCP so he called and instructed her to come to ED for further workup.  Daughter reports patient had colonoscopy at [facility]20 20 which was significant for polyps which were resected then. - In ED workup significant for hemoglobin of 6.3, Hemoccult was negative, light brown color stool with no evidence of melena, he had +1 edema, proBNP elevated at 2427, negative troponins, MCV 70.8, [facility] hospitalist consulted to admit.   Review of systems:      A full 10 point Review of Systems was done, except as stated above, all other Review of Systems were negative.   With Past History of the following :    Past Medical History:  Diagnosis Date   Atrial fibrillation (HCC)    Coronary artery disease    Hypertension    Nonrheumatic aortic (valve) stenosis       Past Surgical History:  Procedure Laterality  Date   TRANSCATHETER AORTIC VALVE REPLACEMENT, TRANSFEMORAL        Social History:     Social History   Tobacco Use   Smoking status: Former    Current packs/day: 0.00    Types: Cigarettes    Quit date: 2003    Years since quitting: 23.0   Smokeless tobacco: Never  Substance Use Topics   Alcohol use: Not Currently       Family History :     Family History  Problem Relation Age of Onset   Memory loss Neg Hx       Home Medications:   Prior to Admission medications  Medication Sig Start Date End Date Taking? Authorizing Provider  apixaban  (ELIQUIS ) 2.5 MG TABS tablet Take by mouth 2 (two) times daily.    [provider]  donepezil  (ARICEPT ) 10 MG tablet Take 1 tablet (10 mg total) by mouth daily. 02/03/24  Whitfield Raisin, NP  furosemide  (LASIX ) 20 MG tablet Take 1 tablet (20 mg total) by mouth daily as needed. Patient taking differently: Take 20 mg by mouth daily as needed. 1 tab mon, wed, fri. 1/2 other days 11/01/19   Arlice Reichert, MD  meloxicam (MOBIC) 7.5 MG tablet as needed.  09/21/19   [provider]  metoprolol  tartrate (LOPRESSOR ) 50 MG tablet Take 50 mg by mouth 2 (two) times daily.    [provider]  omeprazole (PRILOSEC) 20 MG capsule Take 20 mg by mouth daily before breakfast.    [provider]  potassium chloride  (KLOR-CON ) 10 MEQ tablet Take 10 mEq by mouth 2 (two) times daily.    [provider]  sertraline  (ZOLOFT ) 50 MG tablet Take 1 tablet (50 mg total) by mouth at bedtime. 02/03/24   Whitfield Raisin, NP  traZODone  (DESYREL ) 100 MG tablet Take 1 tablet (100 mg total) by mouth at bedtime. 02/03/24   Whitfield Raisin, NP     Allergies:    Allergies[1]   Physical Exam:   Vitals  Blood pressure 124/64, pulse 69, temperature 98.4 F (36.9 C), temperature source Oral, resp. rate 17, height 4' 10 (1.473 m), weight 47.2 kg, SpO2 100%.   1. General Frail, elderly female, laying in bed in no apparent  distress  2.  Awake, alert, oriented x 3, pleasant, she is with mild dementia   3. No F.N deficits, ALL C.Nerves Intact, Strength 5/5 all 4 extremities, Sensation intact all 4 extremities, Plantars down going.  4. Ears and Eyes appear Normal, Conjunctivae clear, PERRLA. Moist Oral Mucosa.  5. No cervical lymphadenopathy appriciated, No Carotid Bruits.  6. Symmetrical Chest wall movement, air entry at the bases with rales  7.  irregular, no rubs or gallops, +1 edema  8. Positive Bowel Sounds, Abdomen Soft, No tenderness, No organomegaly appriciated,No rebound -guarding or rigidity.  9.  No Cyanosis, Normal Skin Turgor, No Skin Rash or Bruise.  10. Good muscle tone,  joints appear normal , no effusions, Normal ROM.    Data Review:    CBC Recent Labs  Lab 02/20/24 1350  WBC 11.8*  HGB 6.3*  HCT 24.5*  PLT 260  MCV 70.8*  MCH 18.2*  MCHC 25.7*  RDW 17.2*  LYMPHSABS 1.5  MONOABS 0.8  EOSABS 0.1  BASOSABS 0.1   ------------------------------------------------------------------------------------------------------------------  Chemistries  Recent Labs  Lab 02/20/24 1350  NA 141  K 3.7  CL 106  CO2 21*  GLUCOSE 100*  BUN 13  CREATININE 1.01*  CALCIUM 9.2   ------------------------------------------------------------------------------------------------------------------ estimated creatinine clearance is 25.3 mL/min (A) (by C-G formula based on SCr of 1.01 mg/dL (H)). ------------------------------------------------------------------------------------------------------------------ No results for input(s): TSH, T4TOTAL, T3FREE, THYROIDAB in the last 72 hours.  Invalid input(s): FREET3  Coagulation profile No results for input(s): INR, PROTIME in the last 168 hours. ------------------------------------------------------------------------------------------------------------------- No results for input(s): DDIMER in the last 72  hours. -------------------------------------------------------------------------------------------------------------------  Cardiac Enzymes No results for input(s): CKMB, TROPONINI, MYOGLOBIN in the last 168 hours.  Invalid input(s): CK ------------------------------------------------------------------------------------------------------------------ No results found for: BNP   ---------------------------------------------------------------------------------------------------------------  Urinalysis    Component Value Date/Time   COLORURINE YELLOW 10/30/2019 1916   APPEARANCEUR CLEAR 10/30/2019 1916   LABSPEC 1.004 (L) 10/30/2019 1916   PHURINE 6.0 10/30/2019 1916   GLUCOSEU NEGATIVE 10/30/2019 1916   HGBUR NEGATIVE 10/30/2019 1916   BILIRUBINUR NEGATIVE 10/30/2019 1916   KETONESUR NEGATIVE 10/30/2019 1916   PROTEINUR NEGATIVE 10/30/2019 1916   NITRITE NEGATIVE 10/30/2019 1916  LEUKOCYTESUR SMALL (A) 10/30/2019 1916    ----------------------------------------------------------------------------------------------------------------   Imaging Results:    DG Chest 2 View Result Date: 02/20/2024 CLINICAL DATA:  Shortness of breath EXAM: CHEST - 2 VIEW COMPARISON:  None Available. FINDINGS: The heart size and mediastinal contours are within normal limits. Status post transcatheter aortic valve repair. Left lung is clear minimal right pleural effusion is noted with minimal right basilar subsegmental atelectasis. Nodular density seen in right midlung. CT scan is recommended for further evaluation. The visualized skeletal structures are unremarkable. IMPRESSION: 1. Nodular density seen in right midlung. CT scan of the chest is recommended for further evaluation. 2. Minimal right pleural effusion is noted with minimal right basilar subsegmental atelectasis. Electronically Signed   By: Lynwood Landy Raddle M.D.   On: 02/20/2024 14:36    My personal review of EKG: Rhythm A-fib, Rate  84  /min, QTc 449 , no Acute ST changes   Assessment & Plan:    Principal Problem:   Symptomatic anemia Active Problems:   Hyperlipidemia, mixed   PAF (paroxysmal atrial fibrillation) (HCC)   Coronary artery disease involving native coronary artery of native heart without angina pectoris    Symptomatic anemia Microcytic anemia -No clear etiology of her anemia, she is Hemoccult negative, she denies any signs or symptoms of GI bleed.  She is Hemoccult negative in ED, with light brown color stool. - Symptomatic, will transfuse 2 units PRBC. - Will obtain anemia panel. - Appears she has history of receiving IV iron transfusion in the past.  Addendum - Patient workup significant for iron deficiency anemia, low ferritin and iron level, I will switch her Eliquis  to full dose Lovenox and put routine consult for GI to see if she would be a candidate for endoscopic procedure either here or an outpatient as part of her iron-deficiency anemia workup.  Acute CHF - Unfortunately cannot review any previous record for her from her cardiologist, unclear if she has history of CHF or not, but she was started on Lasix  recently. - Will obtain 2D,. - Will start on IV Lasix . - Continue with daily weights and strict ins and out.  Lung nodule - Chest x-ray with evidence of mid lung nodule, will proceed with CT chest without contrast) by reviewing records she had a PET scan in 2021 with right middle lobe nodule not FDG avid on PET)  Hyperlipidemia - Continue with home regimen  Paroxysmal A-fib - Continue with metoprolol  for heart rate control, continue with anticoagulation as well.  Dementia - Continue with donepezil    Aortic stenosis  status post aortic valve replacement   Depression -Resume home meds including trazodone  and sertraline  as well  Hypertension - Continue with home metoprolol , will add as needed hydralazine as well.  History of thyroid nodule - Reviewing his imaging had thyroid  nodules, status post ultrasound and biopsy in 2021, does appear she was on Hemosol which has been discontinued, will defer further workup to her PCP.   DVT Prophylaxis on Eliquis  at home  AM Labs Ordered, also please review Full Orders  Family Communication: Admission, patients condition and plan of care including tests being ordered have been discussed with the patient and son at bedside and daughter by phone who indicate understanding and agree with the plan and Code Status.  Code Status DNR  Likely DC to home  Consults called: None  Admission status: Inpatient  Time spent in minutes : 70 minutes   Brayton Lye M.D on 02/20/2024 at 4:30 PM  [facility] - Office  [phone]        [1]  Allergies Allergen Reactions   Betadine [Povidone Iodine] Other (See Comments)   Latex Other (See Comments)    unkn   Penicillins Swelling   Sulfa Antibiotics Other (See Comments)    unkn   "

## 2024-02-21 ENCOUNTER — Inpatient Hospital Stay (HOSPITAL_COMMUNITY)

## 2024-02-21 DIAGNOSIS — I2510 Atherosclerotic heart disease of native coronary artery without angina pectoris: Secondary | ICD-10-CM

## 2024-02-21 DIAGNOSIS — I5032 Chronic diastolic (congestive) heart failure: Secondary | ICD-10-CM

## 2024-02-21 DIAGNOSIS — E782 Mixed hyperlipidemia: Secondary | ICD-10-CM | POA: Diagnosis not present

## 2024-02-21 DIAGNOSIS — D509 Iron deficiency anemia, unspecified: Secondary | ICD-10-CM | POA: Diagnosis not present

## 2024-02-21 DIAGNOSIS — R911 Solitary pulmonary nodule: Secondary | ICD-10-CM | POA: Diagnosis not present

## 2024-02-21 DIAGNOSIS — I480 Paroxysmal atrial fibrillation: Secondary | ICD-10-CM

## 2024-02-21 DIAGNOSIS — R0609 Other forms of dyspnea: Secondary | ICD-10-CM

## 2024-02-21 DIAGNOSIS — D649 Anemia, unspecified: Secondary | ICD-10-CM | POA: Diagnosis not present

## 2024-02-21 LAB — ECHOCARDIOGRAM COMPLETE
AR max vel: 1.46 cm2
AV Area VTI: 1.8 cm2
AV Area mean vel: 1.79 cm2
AV Mean grad: 5 mmHg
AV Peak grad: 9.9 mmHg
Ao pk vel: 1.57 m/s
Area-P 1/2: 4.33 cm2
Height: 58 in
MV VTI: 1.36 cm2
S' Lateral: 3.3 cm
Weight: 1724.88 [oz_av]

## 2024-02-21 LAB — TYPE AND SCREEN
ABO/RH(D): O POS
Antibody Screen: NEGATIVE
Unit division: 0
Unit division: 0

## 2024-02-21 LAB — CBC
HCT: 27.7 % — ABNORMAL LOW (ref 36.0–46.0)
Hemoglobin: 8.5 g/dL — ABNORMAL LOW (ref 12.0–15.0)
MCH: 22.2 pg — ABNORMAL LOW (ref 26.0–34.0)
MCHC: 30.7 g/dL (ref 30.0–36.0)
MCV: 72.3 fL — ABNORMAL LOW (ref 80.0–100.0)
Platelets: 165 K/uL (ref 150–400)
RBC: 3.83 MIL/uL — ABNORMAL LOW (ref 3.87–5.11)
RDW: 19.5 % — ABNORMAL HIGH (ref 11.5–15.5)
WBC: 6.3 K/uL (ref 4.0–10.5)
nRBC: 0 % (ref 0.0–0.2)

## 2024-02-21 LAB — BPAM RBC
Blood Product Expiration Date: 202601282359
Blood Product Expiration Date: 202601282359
ISSUE DATE / TIME: 202601021715
ISSUE DATE / TIME: 202601022100
Unit Type and Rh: 5100
Unit Type and Rh: 5100

## 2024-02-21 LAB — BASIC METABOLIC PANEL WITH GFR
Anion gap: 7 (ref 5–15)
BUN: 13 mg/dL (ref 8–23)
CO2: 30 mmol/L (ref 22–32)
Calcium: 8.7 mg/dL — ABNORMAL LOW (ref 8.9–10.3)
Chloride: 106 mmol/L (ref 98–111)
Creatinine, Ser: 0.88 mg/dL (ref 0.44–1.00)
GFR, Estimated: >60 mL/min
Glucose, Bld: 74 mg/dL (ref 70–99)
Potassium: 2.9 mmol/L — ABNORMAL LOW (ref 3.5–5.1)
Sodium: 143 mmol/L (ref 135–145)

## 2024-02-21 LAB — FOLATE: Folate: >20 ng/mL

## 2024-02-21 MED ORDER — ENOXAPARIN SODIUM 60 MG/0.6ML IJ SOSY: 50.0000 mg | PREFILLED_SYRINGE | INTRAMUSCULAR | Status: DC

## 2024-02-21 MED ORDER — POTASSIUM CHLORIDE CRYS ER 20 MEQ PO TBCR
40.0000 meq | EXTENDED_RELEASE_TABLET | Freq: Two times a day (BID) | ORAL | Status: DC
  Administered 2024-02-21: 40 meq via ORAL
  Filled 2024-02-21: qty 2

## 2024-02-21 MED ORDER — APIXABAN 2.5 MG PO TABS: 2.5000 mg | ORAL_TABLET | Freq: Two times a day (BID) | ORAL | Status: AC

## 2024-02-21 MED ORDER — OMEPRAZOLE 20 MG PO CPDR: 40.0000 mg | DELAYED_RELEASE_CAPSULE | Freq: Every day | ORAL | 2 refills | Status: AC

## 2024-02-21 NOTE — Discharge Summary (Signed)
 " Physician Discharge Summary   Patient: Lindsay Scott MRN: [id number] DOB: Dec 01, 1934  Admit date:     02/20/2024  Discharge date: 02/21/2024  Discharge Physician: Eric Nunnery   PCP: Luetta Chew, MD   Recommendations at discharge:  Repeat CBC to follow hemoglobin trend/stability Repeat basic metabolic panel to follow electrolytes and renal function. Repeat CT scan of the chest in 39-months to follow-up and further decide intervention if needed for lung nodule. Make sure patient has follow-up with gastroenterology service as instructed.  Discharge Diagnoses: Principal Problem:   Symptomatic anemia Active Problems:   Hyperlipidemia, mixed   PAF (paroxysmal atrial fibrillation) (HCC)   Coronary artery disease involving native coronary artery of native heart without angina pectoris   Lung nodule   Chronic diastolic CHF (congestive heart failure) [facility])  Brief Hospital admission narrative: Lindsay Scott  is a 89 y.o. female,  with PMH of nonrheumatic aortic stenosis status post TAVR, dementia, paroxysmal A. fib on chronic anticoagulation with Eliquis , hypertension and CAD; who presented to the emergency department upon instructions from her PCP for anemia. Patient with progressive dyspnea over the last few weeks, as well as lower extremity edema, she was recently prescribed potassium and Lasix  by her PCP, unclear if she has diagnosis of CHF in the past, but she is with known history of paroxysmal A-fib on Eliquis , and history of aortic valve disease status post TAVR, denies fever, chills, she denies any hard stools or coffee-ground emesis, apparently her hemoglobin was low at PCP so he called and instructed her to come to ED for further workup.  Daughter reports patient had colonoscopy (2020) which was significant for polyps which were resected then. - In ED workup significant for hemoglobin of 6.3, Hemoccult was negative, light brown color stool with no evidence of melena, he had +1  edema, proBNP elevated at 2427, negative troponins, MCV 70.8, [facility] hospitalist consulted to admit.  Assessment and Plan: 1-symptomatic iron deficiency anemia - S/p 2 units PRBCs transfused - Anemia panel demonstrating iron deficiency anemia - After discussing with patient's daughter and GI service decision made to proceed symptomatic/conservative management and to not have endoscopic evaluation at the moment. - Patient Hemoccult test was negative - 40 mg PPI on daily basis recommended - Avoid all NSAIDs - Safe to resume the use of Eliquis  on 02/22/2024. - Outpatient follow-up with GI/PCP has been  2-elevated BNP/mild acute on chronic diastolic heart failure - Continue patient with cardiology service - Resume home diuretics - Heart healthy/low-sodium diet discussed with patient and family - Maintain adequate hydration - Check weight on daily basis.  3-hypokalemia - In the setting of diuresis - Electrolytes have been repleted - Continue daily supplementation - Repeat basic metabolic panel to follow trend after discharge.  4-paroxysmal atrial fibrillation - Continue rate controlling agent and the use of anticoagulation as instructed above. - Continue patient follow-up with cardiology service.  5-lung nodule - Continue outpatient follow-up; repeat CT chest in 60-months.  Other options include PET CT or tissue sampling.  6-hyperlipidemia - Continue statin.  7-dementia - Continue donepezil  - Continue supportive care and constant reorientation.  8-history of aortic stenosis - Status post TAVR - Continue patient follow-up with cardiology service.  9-hypertension - Stable and well-controlled - Continue current antihypertensive agents - Follow vital signs.   Consultants: GI service Procedures performed: See below for x-ray reports. Disposition: Home Diet recommendation: Heart healthy/low-sodium diet.  DISCHARGE MEDICATION: Allergies as of 02/21/2024       Reactions  Betadine [povidone Iodine] Other (See Comments)   Latex Other (See Comments)   unkn   Penicillins Swelling   Sulfa Antibiotics Other (See Comments)   unkn        Medication List     STOP taking these medications    meloxicam 7.5 MG tablet Commonly known as: MOBIC       TAKE these medications    apixaban  2.5 MG Tabs tablet Commonly known as: ELIQUIS  Take 1 tablet (2.5 mg total) by mouth 2 (two) times daily. Start taking on: February 22, 2024 What changed: how much to take   donepezil  10 MG tablet Commonly known as: ARICEPT  Take 1 tablet (10 mg total) by mouth daily.   furosemide  20 MG tablet Commonly known as: LASIX  Take 1 tablet (20 mg total) by mouth daily as needed. What changed: additional instructions   metoprolol  tartrate 50 MG tablet Commonly known as: LOPRESSOR  Take 50 mg by mouth 2 (two) times daily.   omeprazole  20 MG capsule Commonly known as: PRILOSEC Take 2 capsules (40 mg total) by mouth daily before breakfast. What changed: how much to take   potassium chloride  10 MEQ tablet Commonly known as: KLOR-CON  Take 10 mEq by mouth 2 (two) times daily.   sertraline  50 MG tablet Commonly known as: ZOLOFT  Take 1 tablet (50 mg total) by mouth at bedtime.   traZODone  100 MG tablet Commonly known as: DESYREL  Take 1 tablet (100 mg total) by mouth at bedtime.        Follow-up Information     Vasireddy, Waynetta, MD. Schedule an appointment as soon as possible for a visit in 10 day(s).   Specialty: Internal Medicine Contact information: [address] [place] [phone]                Discharge Exam: Filed Weights   02/20/24 1313 02/21/24 0500  Weight: 47.2 kg 48.9 kg   General exam: Alert, awake, following commands appropriately and in no acute distress. Respiratory system: Good air movement bilaterally; no wheezing or crackles.  Good saturation on room air. Cardiovascular system: Rate controlled, no rubs, no gallops, no  JVD. Gastrointestinal system: Abdomen is nondistended, soft and nontender. No organomegaly or masses felt. Normal bowel sounds heard. Central nervous system: Moving 4 limbs spontaneously.  No focal neurological deficits. Extremities: No cyanosis or clubbing. Skin: No petechiae. Psychiatry: Judgement and insight appear stable for patient; no suicidal ideation or hallucinations.  Patient with underlying history of dementia.   Condition at discharge: Stable and improved.  The results of significant diagnostics from this hospitalization (including imaging, microbiology, ancillary and laboratory) are listed below for reference.   Imaging Studies: CT CHEST WO CONTRAST Result Date: 02/20/2024 CLINICAL DATA:  Nodular density in the right mid lung zone on recent chest radiographs. Further evaluation with CT was recommended. EXAM: CT CHEST WITHOUT CONTRAST TECHNIQUE: Multidetector CT imaging of the chest was performed following the standard protocol without IV contrast. RADIATION DOSE REDUCTION: This exam was performed according to the departmental dose-optimization program which includes automated exposure control, adjustment of the mA and/or kV according to patient size and/or use of iterative reconstruction technique. COMPARISON:  Chest radiographs dated 02/20/2024 FINDINGS: Cardiovascular: Atheromatous calcifications, including the coronary arteries and aorta. TAVR. Enlarged heart. No pericardial effusion. Diffuse low-density of the blood relative to the arterial walls. Mediastinum/Nodes: Multiple right lobe thyroid nodules, the largest measuring 1.1 cm. These do not need imaging follow-up. Air in the anterior aspect of the left innominate vein, compatible  with recent intravenous access. No enlarged lymph nodes. Unremarkable trachea and esophagus. Lungs/Pleura: Ground-glass opacities in both lungs, involving all lobes. Solid right middle lobe nodule measuring 14 x 9 mm on image number 57/4, corresponding to  the nodule seen on the radiographs. This measures 14 mm in length on sagittal image number 51/8. Small right lower lobe calcified granuloma. Moderate-sized right pleural effusion and small to moderate-sized left pleural effusion. Mild compressive right lower lobe atelectasis. Upper Abdomen: Sludge and possible small, faintly calcified gallstones in the gallbladder. No gallbladder wall thickening or pericholecystic fluid. Atheromatous arterial calcifications. Colonic diverticulosis. Left adrenal hyperplasia and 1.1 cm adenoma. This does not need imaging follow-up. Diffuse subcutaneous edema. Musculoskeletal: Diffuse subcutaneous edema. Thoracic and lower cervical spine degenerative changes. Approximately 20% T5 superior endplate compression deformity with sclerosis and no acute fracture lines. Minimal bony retropulsion without canal stenosis. IMPRESSION: 1. 12 mm mean diameter right middle lobe nodule. Consider one of the following in 3 months for both low-risk and high-risk individuals: (a) repeat chest CT, (b) follow-up PET-CT, or (c) tissue sampling. This recommendation follows the consensus statement: Guidelines for Management of Incidental Pulmonary Nodules Detected on CT Images: From the Fleischner Society 2017; Radiology 2017; 284:228-243. 2. Congestive heart failure with pulmonary edema and bilateral pleural effusions. 3. Mild compressive right lower lobe atelectasis. 4. Cardiomegaly. 5. Diffuse low-density of the blood relative to the arterial walls, compatible with anemia. 6. Sludge and possible small, faintly calcified gallstones in the gallbladder. 7. Colonic diverticulosis. 8. Diffuse subcutaneous edema. 9. Approximately 20% old T5 superior endplate compression deformity. 10. Calcific coronary artery and aortic atherosclerosis. Aortic Atherosclerosis (ICD10-I70.0). Electronically Signed   By: Elspeth Bathe M.D.   On: 02/20/2024 17:58   DG Chest 2 View Result Date: 02/20/2024 CLINICAL DATA:  Shortness of  breath EXAM: CHEST - 2 VIEW COMPARISON:  None Available. FINDINGS: The heart size and mediastinal contours are within normal limits. Status post transcatheter aortic valve repair. Left lung is clear minimal right pleural effusion is noted with minimal right basilar subsegmental atelectasis. Nodular density seen in right midlung. CT scan is recommended for further evaluation. The visualized skeletal structures are unremarkable. IMPRESSION: 1. Nodular density seen in right midlung. CT scan of the chest is recommended for further evaluation. 2. Minimal right pleural effusion is noted with minimal right basilar subsegmental atelectasis. Electronically Signed   By: Lynwood Landy Raddle M.D.   On: 02/20/2024 14:36    Microbiology: Results for orders placed or performed during the hospital encounter of 10/30/19  SARS Coronavirus 2 by RT PCR (hospital order, performed in [facility] hospital lab) Nasopharyngeal Nasopharyngeal Swab     Status: None   Collection Time: 10/30/19  8:19 PM   Specimen: Nasopharyngeal Swab  Result Value Ref Range Status   SARS Coronavirus 2 NEGATIVE NEGATIVE Final    Comment: (NOTE) SARS-CoV-2 target nucleic acids are NOT DETECTED.  The SARS-CoV-2 RNA is generally detectable in upper and lower respiratory specimens during the acute phase of infection. The lowest concentration of SARS-CoV-2 viral copies this assay can detect is 250 copies / mL. A negative result does not preclude SARS-CoV-2 infection and should not be used as the sole basis for treatment or other patient management decisions.  A negative result may occur with improper specimen collection / handling, submission of specimen other than nasopharyngeal swab, presence of viral mutation(s) within the areas targeted by this assay, and inadequate number of viral copies (<250 copies / mL). A negative result must be  combined with clinical observations, patient history, and epidemiological information.  Fact Sheet for  Patients:   boilerbrush.com.cy  Fact Sheet for Healthcare Providers: https://pope.com/  This test is not yet approved or  cleared by the [place]  FDA and has been authorized for detection and/or diagnosis of SARS-CoV-2 by FDA under an Emergency Use Authorization (EUA).  This EUA will remain in effect (meaning this test can be used) for the duration of the COVID-19 declaration under Section 564(b)(1) of the Act, 21 U.S.C. section 360bbb-3(b)(1), unless the authorization is terminated or revoked sooner.  Performed at [facility], [address] [place], [place] [address]     Labs: CBC: Recent Labs  Lab 02/20/24 1350 02/21/24 0427  WBC 11.8* 6.3  NEUTROABS 9.2*  --   HGB 6.3* 8.5*  HCT 24.5* 27.7*  MCV 70.8* 72.3*  PLT 260 165   Basic Metabolic Panel: Recent Labs  Lab 02/20/24 1350 02/21/24 0427  NA 141 143  K 3.7 2.9*  CL 106 106  CO2 21* 30  GLUCOSE 100* 74  BUN 13 13  CREATININE 1.01* 0.88  CALCIUM 9.2 8.7*   Discharge time spent:  35 minutes.  Signed: Eric Nunnery, MD [facility] 02/21/2024 "

## 2024-02-21 NOTE — Evaluation (Signed)
 Physical Therapy Evaluation Patient Details Name: Lindsay Scott MRN: [id number] DOB: 1934/09/27 Today's Date: 02/21/2024  History of Present Illness  Lindsay Scott  is a 89 y.o. female,  with PMH of nonrheumatic aortic stenosis status post TAVR, paroxysmal A. fib on chronic anticoagulation with Eliquis , hypertension, CAD.   - Patient presents to ED upon instructions from her PCP for anemia, patient dementia, son at bedside help with the history, patient with progressive dyspnea over the last few weeks, as well as lower extremity edema, she was recently prescribed potassium and Lasix  by her PCP, unclear if she has diagnosis of CHF in the past, but she is with known history of paroxysmal A-fib on Eliquis , and history of aortic valve disease status post TAVR, denies fever, chills, she denies any hard stools or coffee-ground emesis, apparently her hemoglobin was low at PCP so he called and instructed her to come to ED for further workup.  Daughter reports patient had colonoscopy at [facility] 20 which was significant for polyps which were resected then.   Clinical Impression  Patient functioning near baseline for functional mobility and gait demonstrating good return for ambulating in room and hallway without loss of balance or need for an AD. Plan:  Patient discharged from physical therapy to care of nursing for ambulation daily as tolerated for length of stay.          If plan is discharge home, recommend the following: A little help with bathing/dressing/bathroom;Help with stairs or ramp for entrance;Assistance with cooking/housework;Assist for transportation;A little help with walking and/or transfers   Can travel by private vehicle        Equipment Recommendations None recommended by PT  Recommendations for Other Services       Functional Status Assessment Patient has had a recent decline in their functional status and/or demonstrates limited ability to make significant improvements in function  in a reasonable and predictable amount of time     Precautions / Restrictions Precautions Precautions: Fall Recall of Precautions/Restrictions: Impaired Restrictions Weight Bearing Restrictions Per Provider Order: No      Mobility  Bed Mobility Overal bed mobility: Independent                  Transfers Overall transfer level: Modified independent                      Ambulation/Gait Ambulation/Gait assistance: Modified independent (Device/Increase time) Gait Distance (Feet): 80 Feet Assistive device: None Gait Pattern/deviations: WFL(Within Functional Limits), Drifts right/left Gait velocity: decreased     General Gait Details: grossly WFL with good return for ambulating in room, hallway with occasional drifting left/right without loss of balance  Stairs            Wheelchair Mobility     Tilt Bed    Modified Rankin (Stroke Patients Only)       Balance Overall balance assessment: Mild deficits observed, not formally tested                                           Pertinent Vitals/Pain Pain Assessment Pain Assessment: No/denies pain    Home Living Family/patient expects to be discharged to:: Private residence Living Arrangements: Children Available Help at Discharge: Family;Available PRN/intermittently Type of Home: House Home Access: Stairs to enter Entrance Stairs-Rails: Right;Left;Can reach both Entrance Stairs-Number of Steps: 6-7   Home Layout: One  level Home Equipment: Agricultural Consultant (2 wheels)      Prior Function Prior Level of Function : Needs assist       Physical Assist : Mobility (physical);ADLs (physical) Mobility (physical): Bed mobility;Transfers;Gait;Stairs   Mobility Comments: household ambulation without AD, uses RW for longer distances ADLs Comments: Assisted by family     Extremity/Trunk Assessment   Upper Extremity Assessment Upper Extremity Assessment: Overall WFL for tasks  assessed    Lower Extremity Assessment Lower Extremity Assessment: Overall WFL for tasks assessed    Cervical / Trunk Assessment Cervical / Trunk Assessment: Normal  Communication   Communication Communication: No apparent difficulties    Cognition Arousal: Alert     PT - Cognitive impairments: History of cognitive impairments                         Following commands: Intact       Cueing Cueing Techniques: Verbal cues, Tactile cues     General Comments      Exercises     Assessment/Plan    PT Assessment Patient does not need any further PT services  PT Problem List         PT Treatment Interventions      PT Goals (Current goals can be found in the Care Plan section)  Acute Rehab PT Goals Patient Stated Goal: return home with family to assist PT Goal Formulation: With patient Time For Goal Achievement: 02/21/24 Potential to Achieve Goals: Good    Frequency       Co-evaluation               AM-PAC PT 6 Clicks Mobility  Outcome Measure Help needed turning from your back to your side while in a flat bed without using bedrails?: None Help needed moving from lying on your back to sitting on the side of a flat bed without using bedrails?: None Help needed moving to and from a bed to a chair (including a wheelchair)?: None Help needed standing up from a chair using your arms (e.g., wheelchair or bedside chair)?: None Help needed to walk in hospital room?: A Little Help needed climbing 3-5 steps with a railing? : A Little 6 Click Score: 22    End of Session   Activity Tolerance: Patient tolerated treatment well Patient left: in chair;with call bell/phone within reach Nurse Communication: Mobility status PT Visit Diagnosis: Unsteadiness on feet (R26.81);Other abnormalities of gait and mobility (R26.89);Muscle weakness (generalized) (M62.81)    Time: 9058-9042 PT Time Calculation (min) (ACUTE ONLY): 16 min   Charges:   PT  Evaluation $PT Eval Low Complexity: 1 Low PT Treatments $Therapeutic Activity: 8-22 mins PT General Charges $$ ACUTE PT VISIT: 1 Visit         12:28 PM, 02/21/2024 Lynwood Music, MPT Physical Therapist with [facility] [phone] office [phone] mobile phone

## 2024-02-21 NOTE — Consult Note (Signed)
 Pharmacy Consult Note - Anticoagulation  Pharmacy Consult for enoxaparin  Indication: atrial fibrillation  PATIENT MEASUREMENTS: Height: 4' 10 (147.3 cm) Weight: 48.9 kg (107 lb 12.9 oz) IBW/kg (Calculated) : 40.9 HEPARIN DW (KG): 47.2  VITAL SIGNS: Temp: 97.8 F (36.6 C) (01/03 0500) Temp Source: Oral (01/03 0500) BP: 141/70 (01/03 0800) Pulse Rate: 94 (01/03 0500)  Recent Labs    02/20/24 2121 02/21/24 0427  HGB  --  8.5*  HCT  --  27.7*  PLT  --  165  HEPARINUNFRC >1.10*  --   CREATININE  --  0.88    Estimated Creatinine Clearance: 29.1 mL/min (by C-G formula based on SCr of 0.88 mg/dL).  PAST MEDICAL HISTORY: Past Medical History:  Diagnosis Date   Atrial fibrillation (HCC)    Coronary artery disease    Hypertension    Nonrheumatic aortic (valve) stenosis     ASSESSMENT: 89 y.o. female with PMH Afib on Eliquis  is presenting with symptomatic anemia now s/p two pRBC transfusions. Last dose of Eliquis  was this morning. Per chart, concern for GIB low on differential. Pharmacy has been consulted to initiate and manage enoxaparin .  Pertinent medications: Medications Prior to Admission  Medication Sig Dispense Refill Last Dose/Taking   apixaban  (ELIQUIS ) 2.5 MG TABS tablet Take by mouth 2 (two) times daily.      donepezil  (ARICEPT ) 10 MG tablet Take 1 tablet (10 mg total) by mouth daily. 90 tablet 3    furosemide  (LASIX ) 20 MG tablet Take 1 tablet (20 mg total) by mouth daily as needed. (Patient taking differently: Take 20 mg by mouth daily as needed. 1 tab mon, wed, fri. 1/2 other days) 30 tablet     meloxicam (MOBIC) 7.5 MG tablet as needed.       metoprolol  tartrate (LOPRESSOR ) 50 MG tablet Take 50 mg by mouth 2 (two) times daily.      omeprazole  (PRILOSEC) 20 MG capsule Take 20 mg by mouth daily before breakfast.      potassium chloride  (KLOR-CON ) 10 MEQ tablet Take 10 mEq by mouth 2 (two) times daily.      sertraline  (ZOLOFT ) 50 MG tablet Take 1 tablet (50 mg  total) by mouth at bedtime. 90 tablet 3    traZODone  (DESYREL ) 100 MG tablet Take 1 tablet (100 mg total) by mouth at bedtime. 90 tablet 3     Baseline anticoagulation labs: Recent Labs    02/20/24 1350 02/21/24 0427  HGB 6.3* 8.5*  PLT 260 165    PLAN: Per discussion with admitting provider, will begin enoxaparin  tomorrow morning Hold Eliquis  Initiate enoxaparin  1 mg/kg subQ q24H for crcl 29ml/min CBC daily for now  Mykenzi Vanzile, BS Pharm D, BCPS Clinical Pharmacist 02/21/2024 10:47 AM

## 2024-02-21 NOTE — Consult Note (Signed)
 "     Referring Provider: No ref. provider found Primary Care Physician:  Luetta Chew, MD Primary Gastroenterologist:  Dr.Pandia  Reason for Consultation: Iron deficiency anemia.  HPI: Patient is a pleasant demented 89 year old lady with multiple medical problems including AAS status post TAVR, atrial fibrillation chronically anticoagulated on Eliquis  CAD hypertension who came to the hospital yesterday at the direction of her PCP when found to have a hemoglobin of 6.3 on outpatient labs.  Hemoglobin of 6 confirmed in the ED here Hemoccult negative on DRE.  IDA confirmed.  Has received 2 units of packed RBCs overnight.  Hemoglobin in the 8 range this morning. Additional workup in the ED included chest CT which demonstrated 12 mm right middle lung lesion.  Cardiomegaly and congestive heart failure.  I spoke to patient's daughter [name] Dewberry [phone]).  Patient lives with her daughter.  She watches patient closely.  Has not had any rectal bleeding or dark stool has regular bowel function.  Appetite has been good no nausea or vomiting.  No apparent odynophagia or dysphagia. Patient tells me she underwent a colonoscopy in [place] 5 years ago for rectal bleeding.  Pandia and Patel.  Found to have large bleeding rectal polyps -removed.  Brother and niece with colon cancer.  No other history of GI illness.  Takes occasional Advil PM.  No other NSAIDs including Celebrex for years.  Serum iron 45/TIBC 444 last saturation 10%.  Ferritin 27.  Past Medical History:  Diagnosis Date   Atrial fibrillation (HCC)    Coronary artery disease    Hypertension    Nonrheumatic aortic (valve) stenosis     Past Surgical History:  Procedure Laterality Date   TRANSCATHETER AORTIC VALVE REPLACEMENT, TRANSFEMORAL      Prior to Admission medications  Medication Sig Start Date End Date Taking? Authorizing Provider  apixaban  (ELIQUIS ) 2.5 MG TABS tablet Take by mouth 2 (two) times daily.     [provider]  donepezil  (ARICEPT ) 10 MG tablet Take 1 tablet (10 mg total) by mouth daily. 02/03/24   Whitfield Raisin, NP  furosemide  (LASIX ) 20 MG tablet Take 1 tablet (20 mg total) by mouth daily as needed. Patient taking differently: Take 20 mg by mouth daily as needed. 1 tab mon, wed, fri. 1/2 other days 11/01/19   Arlice Reichert, MD  meloxicam (MOBIC) 7.5 MG tablet as needed.  09/21/19   [provider]  metoprolol  tartrate (LOPRESSOR ) 50 MG tablet Take 50 mg by mouth 2 (two) times daily.    [provider]  omeprazole  (PRILOSEC) 20 MG capsule Take 20 mg by mouth daily before breakfast.    [provider]  potassium chloride  (KLOR-CON ) 10 MEQ tablet Take 10 mEq by mouth 2 (two) times daily.    [provider]  sertraline  (ZOLOFT ) 50 MG tablet Take 1 tablet (50 mg total) by mouth at bedtime. 02/03/24   Whitfield Raisin, NP  traZODone  (DESYREL ) 100 MG tablet Take 1 tablet (100 mg total) by mouth at bedtime. 02/03/24   Whitfield Raisin, NP    Current Facility-Administered Medications  Medication Dose Route Frequency Provider Last Rate Last Admin   0.9 %  sodium chloride  infusion (Manually program via Guardrails IV Fluids)   Intravenous Once Neysa Rouse S, PA-C       acetaminophen  (TYLENOL ) tablet 650 mg  650 mg Oral Q6H PRN Elgergawy, Dawood S, MD       Or   acetaminophen  (TYLENOL ) suppository 650 mg  650 mg Rectal Q6H PRN  Elgergawy, Brayton RAMAN, MD       albuterol  (PROVENTIL ) (2.5 MG/3ML) 0.083% nebulizer solution 2.5 mg  2.5 mg Nebulization Q2H PRN Elgergawy, Dawood S, MD       donepezil  (ARICEPT ) tablet 10 mg  10 mg Oral Daily Elgergawy, Dawood S, MD   10 mg at 02/21/24 9173   enoxaparin  (LOVENOX ) injection 50 mg  50 mg Subcutaneous Q12H Lenon Elsie HERO, RPH   50 mg at 02/21/24 0820   furosemide  (LASIX ) injection 40 mg  40 mg Intravenous Once Elgergawy, Dawood S, MD       hydrALAZINE  (APRESOLINE ) injection 5 mg  5 mg Intravenous Q4H PRN Elgergawy,  Dawood S, MD       melatonin tablet 3 mg  3 mg Oral QHS Elgergawy, Dawood S, MD   3 mg at 02/20/24 2116   metoprolol  tartrate (LOPRESSOR ) tablet 50 mg  50 mg Oral BID Elgergawy, Dawood S, MD   50 mg at 02/21/24 9180   pantoprazole  (PROTONIX ) EC tablet 40 mg  40 mg Oral BID Elgergawy, Dawood S, MD   40 mg at 02/21/24 9180   polyethylene glycol (MIRALAX  / GLYCOLAX ) packet 17 g  17 g Oral Daily PRN Elgergawy, Dawood S, MD       potassium chloride  SA (KLOR-CON  M) CR tablet 40 mEq  40 mEq Oral BID Ricky Fines, MD   40 mEq at 02/21/24 0820   sertraline  (ZOLOFT ) tablet 50 mg  50 mg Oral QHS Elgergawy, Dawood S, MD   50 mg at 02/20/24 2115   traZODone  (DESYREL ) tablet 100 mg  100 mg Oral QHS Elgergawy, Dawood S, MD   100 mg at 02/20/24 2115    Allergies as of 02/20/2024 - Review Complete 02/20/2024  Allergen Reaction Noted   Betadine [povidone iodine] Other (See Comments) 10/30/2019   Latex Other (See Comments) 10/30/2019   Penicillins Swelling 10/30/2019   Sulfa antibiotics Other (See Comments) 10/30/2019    Family History  Problem Relation Age of Onset   Memory loss Neg Hx     Social History   Socioeconomic History   Marital status: Widowed    Spouse name: Not on file   Number of children: Not on file   Years of education: Not on file   Highest education level: Not on file  Occupational History   Not on file  Tobacco Use   Smoking status: Former    Current packs/day: 0.00    Types: Cigarettes    Quit date: 2003    Years since quitting: 23.0   Smokeless tobacco: Never  Vaping Use   Vaping status: Never Used  Substance and Sexual Activity   Alcohol use: Not Currently   Drug use: Never   Sexual activity: Not Currently  Other Topics Concern   Not on file  Social History Narrative   Lives with daughter [name]   Right Handed   Drinks no caffeine   Patient has a sitter that stays with her when [name] is working.    Patient is retired.    Social Drivers of Health    Tobacco Use: Medium Risk (02/20/2024)   Patient History    Smoking Tobacco Use: Former    Smokeless Tobacco Use: Never    Passive Exposure: Not on file  Financial Resource Strain: Not on file  Food Insecurity: No Food Insecurity (02/20/2024)   Epic    Worried About Radiation Protection Practitioner of Food in the Last Year: Never true    [employer] of Food in  the Last Year: Never true  Transportation Needs: No Transportation Needs (02/20/2024)   Epic    Lack of Transportation (Medical): No    Lack of Transportation (Non-Medical): No  Physical Activity: Not on file  Stress: Not on file  Social Connections: Moderately Isolated (02/20/2024)   Social Connection and Isolation Panel    Frequency of Communication with Friends and Family: More than three times a week    Frequency of Social Gatherings with Friends and Family: More than three times a week    Attends Religious Services: 1 to 4 times per year    Active Member of [employer] or Organizations: No    Attends Banker Meetings: Never    Marital Status: Widowed  Intimate Partner Violence: Not At Risk (02/20/2024)   Epic    Fear of Current or Ex-Partner: No    Emotionally Abused: No    Physically Abused: No    Sexually Abused: No  Depression (PHQ2-9): Not on file  Alcohol Screen: Not on file  Housing: High Risk (02/20/2024)   Epic    Unable to Pay for Housing in the Last Year: Yes    Number of Times Moved in the Last Year: 0    Homeless in the Last Year: No  Utilities: Not At Risk (02/20/2024)   Epic    Threatened with loss of utilities: No  Health Literacy: Not on file    Review of Systems:  As in history of present illness  Physical Exam: Vital signs in last 24 hours: Temp:  [97.4 F (36.3 C)-98.6 F (37 C)] 97.8 F (36.6 C) (01/03 0500) Pulse Rate:  [64-94] 94 (01/03 0500) Resp:  [16-19] 18 (01/03 0500) BP: (107-161)/(62-107) 141/70 (01/03 0800) SpO2:  [90 %-100 %] 93 % (01/03 0500) Weight:  [47.2 kg-48.9 kg] 48.9 kg (01/03 0500) Last  BM Date : 02/20/24 General:   Alert, pleasantly demented conversant; no acute distress.  Lungs:  Clear throughout to auscultation.   No wheezes, crackles, or rhonchi. No acute distress. Heart:  Regular rate and rhythm; no murmurs, clicks, rubs,  or gallops. Abdomen: Soft nontender without appreciable mass or organomegaly.   Intake/Output from previous day: 01/02 0701 - 01/03 0700 In: 949 [Blood:949] Out: 3100 [Urine:3100] Intake/Output this shift: No intake/output data recorded.  Lab Results: Recent Labs    02/20/24 1350 02/21/24 0427  WBC 11.8* 6.3  HGB 6.3* 8.5*  HCT 24.5* 27.7*  PLT 260 165   BMET Recent Labs    02/20/24 1350 02/21/24 0427  NA 141 143  K 3.7 2.9*  CL 106 106  CO2 21* 30  GLUCOSE 100* 74  BUN 13 13  CREATININE 1.01* 0.88  CALCIUM 9.2 8.7*   LFT No results for input(s): PROT, ALBUMIN, AST, ALT, ALKPHOS, BILITOT, BILIDIR, IBILI in the last 72 hours. PT/INR No results for input(s): LABPROT, INR in the last 72 hours. Hepatitis Panel No results for input(s): HEPBSAG, HCVAB, HEPAIGM, HEPBIGM in the last 72 hours. C-Diff No results for input(s): CDIFFTOX in the last 72 hours.  Studies/Results: CT CHEST WO CONTRAST Result Date: 02/20/2024 CLINICAL DATA:  Nodular density in the right mid lung zone on recent chest radiographs. Further evaluation with CT was recommended. EXAM: CT CHEST WITHOUT CONTRAST TECHNIQUE: Multidetector CT imaging of the chest was performed following the standard protocol without IV contrast. RADIATION DOSE REDUCTION: This exam was performed according to the departmental dose-optimization program which includes automated exposure control, adjustment of the mA and/or kV according to patient  size and/or use of iterative reconstruction technique. COMPARISON:  Chest radiographs dated 02/20/2024 FINDINGS: Cardiovascular: Atheromatous calcifications, including the coronary arteries and aorta. TAVR. Enlarged heart.  No pericardial effusion. Diffuse low-density of the blood relative to the arterial walls. Mediastinum/Nodes: Multiple right lobe thyroid nodules, the largest measuring 1.1 cm. These do not need imaging follow-up. Air in the anterior aspect of the left innominate vein, compatible with recent intravenous access. No enlarged lymph nodes. Unremarkable trachea and esophagus. Lungs/Pleura: Ground-glass opacities in both lungs, involving all lobes. Solid right middle lobe nodule measuring 14 x 9 mm on image number 57/4, corresponding to the nodule seen on the radiographs. This measures 14 mm in length on sagittal image number 51/8. Small right lower lobe calcified granuloma. Moderate-sized right pleural effusion and small to moderate-sized left pleural effusion. Mild compressive right lower lobe atelectasis. Upper Abdomen: Sludge and possible small, faintly calcified gallstones in the gallbladder. No gallbladder wall thickening or pericholecystic fluid. Atheromatous arterial calcifications. Colonic diverticulosis. Left adrenal hyperplasia and 1.1 cm adenoma. This does not need imaging follow-up. Diffuse subcutaneous edema. Musculoskeletal: Diffuse subcutaneous edema. Thoracic and lower cervical spine degenerative changes. Approximately 20% T5 superior endplate compression deformity with sclerosis and no acute fracture lines. Minimal bony retropulsion without canal stenosis. IMPRESSION: 1. 12 mm mean diameter right middle lobe nodule. Consider one of the following in 3 months for both low-risk and high-risk individuals: (a) repeat chest CT, (b) follow-up PET-CT, or (c) tissue sampling. This recommendation follows the consensus statement: Guidelines for Management of Incidental Pulmonary Nodules Detected on CT Images: From the Fleischner Society 2017; Radiology 2017; 284:228-243. 2. Congestive heart failure with pulmonary edema and bilateral pleural effusions. 3. Mild compressive right lower lobe atelectasis. 4.  Cardiomegaly. 5. Diffuse low-density of the blood relative to the arterial walls, compatible with anemia. 6. Sludge and possible small, faintly calcified gallstones in the gallbladder. 7. Colonic diverticulosis. 8. Diffuse subcutaneous edema. 9. Approximately 20% old T5 superior endplate compression deformity. 10. Calcific coronary artery and aortic atherosclerosis. Aortic Atherosclerosis (ICD10-I70.0). Electronically Signed   By: Elspeth Bathe M.D.   On: 02/20/2024 17:58   DG Chest 2 View Result Date: 02/20/2024 CLINICAL DATA:  Shortness of breath EXAM: CHEST - 2 VIEW COMPARISON:  None Available. FINDINGS: The heart size and mediastinal contours are within normal limits. Status post transcatheter aortic valve repair. Left lung is clear minimal right pleural effusion is noted with minimal right basilar subsegmental atelectasis. Nodular density seen in right midlung. CT scan is recommended for further evaluation. The visualized skeletal structures are unremarkable. IMPRESSION: 1. Nodular density seen in right midlung. CT scan of the chest is recommended for further evaluation. 2. Minimal right pleural effusion is noted with minimal right basilar subsegmental atelectasis. Electronically Signed   By: Lynwood Landy Raddle M.D.   On: 02/20/2024 14:36   Impression:  Pleasantly demented 89 year old lady with multiple comorbidities including congestive heart failure, atrial fibrillation on Eliquis  admitted to the hospital with profound anemia-iron deficient.  Hemoccult negative on admission.  Clinically, no obvious GI bleeding. History of rectal bleeding 5 years ago-found to be due to large polyps removed in [place]. Rare NSAID use.  Right middle lobe pulmonary nodule on CT along with congestive heart failure and cardiomegaly.   Although Hemoccult negative, we need to be concerned about slow GI blood loss from anywhere along her GI tract.  History of large bleeding colon polyps removed previously raises the suspicion  for recurrent neoplasia in her colon.  Again, this lady  is virtually asymptomatic from a GI standpoint.  Lengthy discussion with Dr. Ricky and [name] Dewberry, patient's daughter, regarding potential etiologies.  Regardless of Hemoccult status,  iron deficiency anemia would typically be evaluated in this setting via colonoscopy and possible EGD.  However, this lady is not fit for such invasive procedures.  Family feels that she would not survive anesthesia or or any such invasive procedures and would like to avoid them if at all possible.  Recommendations:  - Track H&H posttransfusion  - If Eliquis  is felt to be clinically necessary, would resume with close clinical vigilance.  - Avoid all NSAIDs; daily PPI empirically  - If progressive anemia noted and/or evidence of GI bleeding develops, would consider stopping the Eliquis  as the risk may not be worth the benefits  - [facility] would be happy to follow-up with this patient in the office in the coming weeks or she can follow-up with her primary GI in [place].  - Management of hypokalemia and pulmonary nodule per attending  - GI recommendations discussed at length with patient's daughter Arty Finner         Notice:  This dictation was prepared with Dragon dictation along with smaller phrase technology. Any transcriptional errors that result from this process are unintentional and may not be corrected upon review.  "

## 2024-02-21 NOTE — Progress Notes (Signed)
 Inpatient Care Manager (ICM) conducted chart review and spoke with patient at bedside to complete brief admission assessment.  She lives at home with her Daughter and was fairly independent PTA with intermittent use of her rolling walker for long distances.  Denies need for additional DME or home services. No discharge needs identified at this time. However, IPCM team will continue to follow along and monitor patient advancement through interdisciplinary progression rounds. If new patient transition needs arise, please enter a ICM consult to prompt IPCM team to follow up.       02/21/24 1451  TOC Brief Assessment  Insurance and Status Reviewed  Patient has primary care physician Yes  Home environment has been reviewed Home with Daughter  Prior level of function: Independent  Prior/Current Home Services No current home services  Social Drivers of Health Review SDOH reviewed no interventions necessary  Readmission risk has been reviewed Yes  Transition of care needs no transition of care needs at this time

## 2024-02-21 NOTE — TOC Transition Note (Signed)
 Transition of Care [facility]) - Discharge Note   Patient Details  Name: Lindsay Scott MRN: [id number] Date of Birth: 25-Mar-1934  [facility] CM/SW Contact:  Ronnald MARLA Sil, RN Phone Number: 02/21/2024, 2:55 PM   Clinical Narrative:     Patient discharging home with Daughter, at baseline function per PT eval, no needs identified.  Final next level of care: Home/Self Care Barriers to Discharge: No Barriers Identified   Patient Goals and CMS Choice  Discharge Placement  Discharge Plan and Services       DME Arranged: N/A DME Agency: NA  Social Drivers of Health (SDOH) Interventions SDOH Screenings   Food Insecurity: No Food Insecurity (02/20/2024)  Housing: High Risk (02/20/2024)  Transportation Needs: No Transportation Needs (02/20/2024)  Utilities: Not At Risk (02/20/2024)  Social Connections: Moderately Isolated (02/20/2024)  Tobacco Use: Medium Risk (02/20/2024)   Readmission Risk Interventions     No data to display

## 2024-02-21 NOTE — Care Management CC44 (Signed)
"         Condition Code 44 Documentation Completed  Patient Details  Name: Lindsay Scott MRN: [id number] Date of Birth: September 20, 1934   Condition Code 44 given:  Yes Patient signature on Condition Code 44 notice:  Yes Documentation of 2 MD's agreement:  Yes Code 44 added to claim:  Yes    Ronnald MARLA Sil, RN 02/21/2024, 10:27 AM  "

## 2024-02-21 NOTE — Plan of Care (Signed)

## 2024-08-30 ENCOUNTER — Ambulatory Visit: Admitting: Adult Health
